# Patient Record
Sex: Male | Born: 1970 | Race: White | Hispanic: No | Marital: Married | Smoking: Never smoker
Health system: Southern US, Community
[De-identification: ages and names within clinical notes are randomized; demographics above are authoritative.]

## PROBLEM LIST (undated history)

## (undated) DIAGNOSIS — T7840XA Allergy, unspecified, initial encounter: Secondary | ICD-10-CM

## (undated) DIAGNOSIS — E785 Hyperlipidemia, unspecified: Secondary | ICD-10-CM

## (undated) DIAGNOSIS — I8390 Asymptomatic varicose veins of unspecified lower extremity: Secondary | ICD-10-CM

## (undated) DIAGNOSIS — K219 Gastro-esophageal reflux disease without esophagitis: Secondary | ICD-10-CM

## (undated) HISTORY — DX: Gastro-esophageal reflux disease without esophagitis: K21.9

## (undated) HISTORY — DX: Asymptomatic varicose veins of unspecified lower extremity: I83.90

## (undated) HISTORY — DX: Hyperlipidemia, unspecified: E78.5

## (undated) HISTORY — DX: Allergy, unspecified, initial encounter: T78.40XA

---

## 2007-09-27 DIAGNOSIS — S86011A Strain of right Achilles tendon, initial encounter: Secondary | ICD-10-CM

## 2007-09-27 HISTORY — DX: Strain of right Achilles tendon, initial encounter: S86.011A

## 2008-06-24 ENCOUNTER — Ambulatory Visit: Payer: Self-pay | Admitting: Family Medicine

## 2008-06-24 DIAGNOSIS — I868 Varicose veins of other specified sites: Secondary | ICD-10-CM

## 2010-08-23 ENCOUNTER — Ambulatory Visit: Payer: Self-pay | Admitting: Family Medicine

## 2010-08-23 DIAGNOSIS — K219 Gastro-esophageal reflux disease without esophagitis: Secondary | ICD-10-CM | POA: Insufficient documentation

## 2010-08-23 DIAGNOSIS — J209 Acute bronchitis, unspecified: Secondary | ICD-10-CM

## 2010-08-23 DIAGNOSIS — E669 Obesity, unspecified: Secondary | ICD-10-CM

## 2010-08-24 ENCOUNTER — Encounter: Payer: Self-pay | Admitting: Family Medicine

## 2010-10-04 ENCOUNTER — Encounter: Payer: Self-pay | Admitting: Family Medicine

## 2010-10-26 NOTE — Assessment & Plan Note (Signed)
Summary: acid reflux rescheduled from [place] being out/dlo   Vital Signs:  Patient profile:   40 year old male Height:      73 inches Weight:      236.0 pounds BMI:     31.25 Temp:     98.2 degrees F oral Pulse rate:   80 / minute Pulse rhythm:   regular BP sitting:   120 / 80  (left arm) Cuff size:   large  Vitals Entered By: Benny Lennert CMA Duncan Dull) (August 23, 2010 9:19 AM)  History of Present Illness: Chief complaint acid reflux/chest cold  1. In last 2 years.. poor traveling diet... intermittant GERd. Using prilosec,  and zantac generic equivalent...only taking as needed. Occ notes food getting stuck in throat, increase in mucus in throat. Occ burning in chest..heartburn occuring several times a week. occ regurgitates food.   All depends on what he eats..tomato, spicy foods, etc..unable to change diet given traveling.  Returned from [place] on 11/15...since then 1- 2 weeks of congestion, productive, brown. No fever.NO SOB. No face pain, no ear pain, some sore throat. Using robitussin DM...cough not keeping up at night.  On smoker.   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 1 Tab By Mouth Daily For 4-6 Weeks, Then Wean Off If Able. 3)  Azithromycin 250 Mg Tabs (Azithromycin) .... 2 Tab By Mouth X 1 Day, Then 1 Tab By Mouth Daily  Allergies (verified): No Known Drug Allergies  Past History:  Past medical, surgical, family and social histories (including risk factors) reviewed, and no changes noted (except as noted below).  Past Medical History: Reviewed history from 06/24/2008 and no changes required. Varicose vein, R leg  Family History: Reviewed history from 06/24/2008 and no changes required. Mom and dad, healthy  GF, DM  PGF, brain  CA  Social History: Reviewed history from 06/24/2008 and no changes required. 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 CV:  Complains of chest pain or discomfort. Resp:  Denies coughing up blood and shortness of breath. GI:  Denies abdominal pain, bloody stools, constipation, and diarrhea. GU:  Denies dysuria.  Physical Exam  General:  tall overweight appearing male in NAD Head:  no maxiullary sinus ttp Eyes:  No corneal or conjunctival inflammation noted. EOMI. Perrla. Funduscopic exam benign, without hemorrhages, exudates or papilledema. Vision grossly normal. Ears:  External ear exam shows no significant lesions or deformities.  Otoscopic examination reveals clear canals, tympanic membranes are intact bilaterally without bulging, retraction, inflammation or discharge. Hearing is grossly normal bilaterally. Nose:  External nasal examination shows no deformity or inflammation. Nasal mucosa are pink and moist without lesions or exudates. Mouth:  MMMpharynx pink and moist.   Neck:  no carotid bruit or thyromegaly no cervical or supraclavicular lymphadenopathy  Lungs:  frequent cough, no w/r/r, no increase work of breathing, clear lung fielkd throughout. Heart:  Normal rate and regular rhythm. S1 and S2 normal without gallop, murmur, click, rub or other extra sounds. Abdomen:  Bowel sounds positive,abdomen soft and non-tender without masses, organomegaly or hernias noted.   Impression & Recommendations:  Problem # 1:  ACUTE BRONCHITIS (ICD-466.0)  His updated medication list for this problem includes:    Robitussin Maximum Strength 15 Mg/61ml Syrp (Dextromethorphan hbr)    Azithromycin 250 Mg Tabs (Azithromycin) .Marland Kitchen... 2 tab by mouth x 1 day, then 1 tab by mouth daily  Take antibiotics and other medications as directed. Encouraged to  push clear liquids, get enough rest, and take acetaminophen as needed. To be seen in 5-7 days if no improvement, sooner if worse.  Problem # 2:  GASTROESOPHAGEAL REFLUX DISEASE (ICD-530.81) PPI x 4-6 weeks course, with lifestyle modification.. Info given. Call if symptoms not improved in 2 weeks for stronger PPI, if that is not successful..consider referral to GI.  Pt request nutrition rerferral given unusual diet given travel frequently..he forsees difficulty making diet adjustments.   His updated medication list for this problem includes:    Omeprazole 40 Mg Cpdr (Omeprazole) .Marland Kitchen... 1 tab by mouth daily for 4-6 weeks, then wean off if able.  Orders: Nutrition Referral (Nutrition)  Problem # 3:  OBESITY (ICD-278.00)  Orders: Nutrition Referral (Nutrition)  Complete Medication List: 1)  Robitussin Maximum Strength 15 Mg/79ml Syrp (Dextromethorphan hbr) 2)  Omeprazole 40 Mg Cpdr (Omeprazole) .Marland Kitchen.. 1 tab by mouth daily for 4-6 weeks, then wean off if able. 3)  Azithromycin 250 Mg Tabs (Azithromycin) .... 2 tab by mouth x 1 day, then 1 tab by mouth daily  Patient Instructions: 1)  Referral Appointment Information 2)  Day/Date: 3)  Time: 4)  Place/MD: 5)  Address: 6)  Phone/Fax: 7)  Patient given appointment information. Information/Orders faxed/mailed.Marland Kitchen 8)  Make lifestyle changes according to reflux info given. 9)   Start omeprazole 40 mg daily for 4-6 week course.  10)  Call if reflux not rimproving in 2 weeks.  11)  Guafenesin during the day to break up mucus. 12)   Start antibiotics. Prescriptions: AZITHROMYCIN 250 MG TABS (AZITHROMYCIN) 2 tab by mouth x 1 day, then 1 tab by mouth daily  #6 x 0   Entered and Authorized by:   Kerby Nora MD   Signed by:   Kerby Nora MD on 08/23/2010   Method used:   Electronically to        [facility]  [address] (retail)       [address]       [place], [place]  [address]       Ph: [phone] or [phone]       Fax: [phone]    RxID:   [id number] OMEPRAZOLE 40 MG CPDR (OMEPRAZOLE) 1 tab by mouth daily for 4-6 weeks, then wean off if able.  #30 x 1   Entered and Authorized by:   Kerby Nora MD   Signed by:   Kerby Nora MD on 08/23/2010   Method used:   [facility]        [facility]  [address] (retail)       [address]       [place], [place]  [address]       Ph: [phone] or [phone]       Fax: [phone]   RxID:   [id number]    Orders Added: 1)  Nutrition Referral [Nutrition] 2)  Est. Patient Level IV [[address]]    Current Allergies (reviewed today): No known allergies

## 2010-10-26 NOTE — Miscellaneous (Signed)
Summary: [facility]  Referral orders/[facility]   Imported By: Lester Woodstock 08/28/2010 09:43:03  _____________________________________________________________________  External Attachment:    Type:   Image     Comment:   External Document

## 2010-10-28 NOTE — Letter (Signed)
Summary: Patient Will Reschedule/[facility]  Patient Will Reschedule/[facility]   Imported By: Lanelle Bal 10/13/2010 10:51:10  _____________________________________________________________________  External Attachment:    Type:   Image     Comment:   External Document

## 2012-04-05 ENCOUNTER — Ambulatory Visit (INDEPENDENT_AMBULATORY_CARE_PROVIDER_SITE_OTHER): Payer: 59 | Admitting: Family Medicine

## 2012-04-05 ENCOUNTER — Encounter: Payer: Self-pay | Admitting: Family Medicine

## 2012-04-05 VITALS — BP 112/84 | HR 72 | Temp 97.4°F | Wt 243.0 lb

## 2012-04-05 DIAGNOSIS — M79609 Pain in unspecified limb: Secondary | ICD-10-CM

## 2012-04-05 DIAGNOSIS — M6752 Plica syndrome, left knee: Secondary | ICD-10-CM

## 2012-04-05 DIAGNOSIS — M7541 Impingement syndrome of right shoulder: Secondary | ICD-10-CM

## 2012-04-05 DIAGNOSIS — M79604 Pain in right leg: Secondary | ICD-10-CM

## 2012-04-05 DIAGNOSIS — M6750 Plica syndrome, unspecified knee: Secondary | ICD-10-CM

## 2012-04-05 NOTE — Progress Notes (Signed)
Nature conservation officer at [facility] [address] [place] [address] Phone: [phone] Fax: [phone]  Date:  04/05/2012   Name:  Louis Marquez   DOB:  1970/10/18   MRN:  [id number]  PCP:  Louis Beat, MD    Chief Complaint: Pain in left knee, right shoulder, problems with right leg   History of Present Illness:  Louis Marquez is a 41 y.o. very pleasant male patient who presents with the following:  Left knee, felt like something poppedd a few weeks ago. When getting up and down, will feel some pain and medially. He is not having any effusions. He has not been having any symptomatic anyway. No locking up of the joint. Most of his discomfort his medial  Still some R knee tenderess.after his reseal operation, he more is some occasional discomfort in his medial upper thigh. Occasionally he did have some tingling sensation, but right now he's not really having a period  Some R sided shoulder impingement.occasional pain with abduction, but this is mostly with doing overhead movements.  Patient Active Problem List  Diagnosis  . OBESITY  . VARICES OF OTHER SITES  . ACUTE BRONCHITIS  . GASTROESOPHAGEAL REFLUX DISEASE   Past Medical History  Diagnosis Date  . Varicose vein of leg    No past surgical history on file. History  Substance Use Topics  . Smoking status: Never Smoker   . Smokeless tobacco: Not on file  . Alcohol Use: Yes   Family History  Problem Relation Age of Onset  . Cancer Paternal Grandfather     brain   No Known Allergies  Medication list has been reviewed and updated.  No current outpatient prescriptions on file prior to visit.    Review of Systems:  GEN: No acute illnesses, no fevers, chills. GI: No n/v/d, eating normally Pulm: No SOB Interactive and getting along well at home.  Otherwise, ROS is as per the HPI.  Physical Examination: Filed Vitals:   04/05/12 1630  BP: 112/84  Pulse: 72  Temp: 97.4 F (36.3 C)    Filed Vitals:   04/05/12 1630  Weight: 243 lb (110.224 kg)   There is no height on file to calculate BMI. Ideal Body Weight:     GEN: WDWN, NAD, Non-toxic, Alert & Oriented x 3 HEENT: Atraumatic, Normocephalic.  Ears and Nose: No external deformity. EXTR: No clubbing/cyanosis/edema NEURO: Normal gait.  PSYCH: Normally interactive. Conversant. Not depressed or anxious appearing.  Calm demeanor.   LEFT knee: No effusion. Nontender on the joint lines. Nontender with patellar facet loading. Moderately tender to medial plical bands they're quite large. Stable to varus and valgus stress. Negative McMurray's. Negative flexion pinch. Negative Lachman maneuver.  RIGHT shoulder: Full range of motion, strength 5/5. Minimal impingement with doing Hawkins and Neer's. Negative crossover.   EKG / Labs / Xrays: None available at time of encounter  Assessment and Plan: 1. Plica syndrome of left knee   2. Impingement syndrome of right shoulder   3. Right leg pain     >25 minutes spent in face to face time with patient, >50% spent in counselling or coordination of care: 30 minutes spent face-to-face, reviewing anatomy of all problems. Discussed pathophysiology and treatment. I think the primary issue with his LEFT knee is so painful plical bands rubbing against his patella. Reviewed rubbing these with his fingers and some ice. If he is still symptomatic in a couple of months, we could certainly inject these.  I think he is having only some rare impingement with lifting weights. We talked about doing some modifications, and I gave him a rotator cuff and scapular stabilization program from [facility] and reviewed this in the office.  Intermittent RIGHT leg pain, the patient did have some extensive varicose veins that he had surgery for. Certainly is not having a pain or would not suspect any intra-articular pathology of the knee or hip. He also has no back pain. Tried to reassure him.  Louis Beat,  MD

## 2012-04-06 ENCOUNTER — Other Ambulatory Visit: Payer: Self-pay | Admitting: Family Medicine

## 2012-04-06 DIAGNOSIS — R5381 Other malaise: Secondary | ICD-10-CM

## 2012-04-06 DIAGNOSIS — Z13220 Encounter for screening for lipoid disorders: Secondary | ICD-10-CM

## 2012-04-25 ENCOUNTER — Other Ambulatory Visit (INDEPENDENT_AMBULATORY_CARE_PROVIDER_SITE_OTHER): Payer: 59

## 2012-04-25 DIAGNOSIS — R5383 Other fatigue: Secondary | ICD-10-CM

## 2012-04-25 DIAGNOSIS — Z13220 Encounter for screening for lipoid disorders: Secondary | ICD-10-CM

## 2012-04-25 DIAGNOSIS — R5381 Other malaise: Secondary | ICD-10-CM

## 2012-04-25 LAB — CBC WITH DIFFERENTIAL/PLATELET
Basophils Absolute: 0 K/uL (ref 0.0–0.1)
Basophils Relative: 0.4 % (ref 0.0–3.0)
Eosinophils Absolute: 0.3 K/uL (ref 0.0–0.7)
Eosinophils Relative: 3.5 % (ref 0.0–5.0)
HCT: 45.4 % (ref 39.0–52.0)
Hemoglobin: 15.5 g/dL (ref 13.0–17.0)
Lymphocytes Relative: 45.9 % (ref 12.0–46.0)
Lymphs Abs: 3.5 K/uL (ref 0.7–4.0)
MCHC: 34.1 g/dL (ref 30.0–36.0)
MCV: 91 fl (ref 78.0–100.0)
Monocytes Absolute: 0.6 K/uL (ref 0.1–1.0)
Monocytes Relative: 7.5 % (ref 3.0–12.0)
Neutro Abs: 3.2 K/uL (ref 1.4–7.7)
Neutrophils Relative %: 42.7 % — ABNORMAL LOW (ref 43.0–77.0)
Platelets: 222 K/uL (ref 150.0–400.0)
RBC: 4.99 Mil/uL (ref 4.22–5.81)
RDW: 13.2 % (ref 11.5–14.6)
WBC: 7.6 K/uL (ref 4.5–10.5)

## 2012-04-25 LAB — LIPID PANEL
Cholesterol: 213 mg/dL — ABNORMAL HIGH (ref 0–200)
HDL: 48.3 mg/dL
Total CHOL/HDL Ratio: 4
Triglycerides: 458 mg/dL — ABNORMAL HIGH (ref 0.0–149.0)
VLDL: 91.6 mg/dL — ABNORMAL HIGH (ref 0.0–40.0)

## 2012-04-25 LAB — LDL CHOLESTEROL, DIRECT: Direct LDL: 89.1 mg/dL

## 2012-04-25 LAB — BASIC METABOLIC PANEL
Calcium: 9.5 mg/dL (ref 8.4–10.5)
Creatinine, Ser: 0.9 mg/dL (ref 0.4–1.5)
GFR: 95.02 mL/min (ref >60.00)

## 2012-04-25 LAB — HEPATIC FUNCTION PANEL
AST: 25 U/L (ref 0–37)
Albumin: 4 g/dL (ref 3.5–5.2)
Alkaline Phosphatase: 63 U/L (ref 39–117)

## 2012-04-25 LAB — TSH: TSH: 3.6 u[IU]/mL (ref 0.35–5.50)

## 2012-04-30 ENCOUNTER — Encounter: Payer: Self-pay | Admitting: Family Medicine

## 2012-04-30 ENCOUNTER — Ambulatory Visit (INDEPENDENT_AMBULATORY_CARE_PROVIDER_SITE_OTHER): Payer: 59 | Admitting: Family Medicine

## 2012-04-30 VITALS — BP 150/100 | HR 114 | Temp 98.6°F | Ht 73.0 in | Wt 244.8 lb

## 2012-04-30 DIAGNOSIS — Z0000 Encounter for general adult medical examination without abnormal findings: Secondary | ICD-10-CM

## 2012-04-30 MED ORDER — UREA 40 % EX CREA: TOPICAL_CREAM | CUTANEOUS | Status: DC

## 2012-04-30 NOTE — Patient Instructions (Addendum)
[employer] WEBSITE HAS A GREAT DEAL OF INFORMATION ABOUT HEART HEALTHY DIETS   Cholesterol Control Diet Cholesterol levels in your body are determined significantly by your diet. Cholesterol levels may also be related to heart disease. The following material helps to explain this relationship and discusses what you can do to help keep your heart healthy. Not all cholesterol is bad. Low-density lipoprotein (LDL) cholesterol is the "bad" cholesterol. It may cause fatty deposits to build up inside your arteries. High-density lipoprotein (HDL) cholesterol is "good." It helps to remove the "bad" LDL cholesterol from your blood. Cholesterol is a very important risk factor for heart disease. Other risk factors are high blood pressure, smoking, stress, heredity, and weight. The heart muscle gets its supply of blood through the coronary arteries. If your LDL cholesterol is high and your HDL cholesterol is low, you are at risk for having fatty deposits build up in your coronary arteries. This leaves less room through which blood can flow. Without sufficient blood and oxygen, the heart muscle cannot function properly and you may feel chest pains (angina pectoris). When a coronary artery closes up entirely, a part of the heart muscle may die, causing a heart attack (myocardial infarction). CHECKING CHOLESTEROL When your caregiver sends your blood to a lab to be analyzed for cholesterol, a complete lipid (fat) profile may be done. With this test, the total amount of cholesterol and levels of LDL and HDL are determined. Triglycerides are a type of fat that circulates in the blood and can also be used to determine heart disease risk. The list below describes what the numbers should be: Test: Total Cholesterol.  Less than 200 mg/dl.  Test: LDL "bad cholesterol."  Less than 100 mg/dl.   Less than 70 mg/dl if you are at very high risk of a heart attack or sudden cardiac death.  Test: HDL "good  cholesterol."  Greater than 50 mg/dl for women.   Greater than 40 mg/dl for men.  Test: Triglycerides.  Less than 150 mg/dl.  CONTROLLING CHOLESTEROL WITH DIET Although exercise and lifestyle factors are important, your diet is key. That is because certain foods are known to raise cholesterol and others to lower it. The goal is to balance foods for their effect on cholesterol and more importantly, to replace saturated and trans fat with other types of fat, such as monounsaturated fat, polyunsaturated fat, and omega-3 fatty acids. On average, a person should consume no more than 15 to 17 g of saturated fat daily. Saturated and trans fats are considered "bad" fats, and they will raise LDL cholesterol. Saturated fats are primarily found in animal products such as meats, butter, and cream. However, that does not mean you need to sacrifice all your favorite foods. Today, there are good tasting, low-fat, low-cholesterol substitutes for most of the things you like to eat. Choose low-fat or nonfat alternatives. Choose round or loin cuts of red meat, since these types of cuts are lowest in fat and cholesterol. Chicken (without the skin), fish, veal, and ground [place] breast are excellent choices. Eliminate fatty meats, such as hot dogs and salami. Even shellfish have little or no saturated fat. Have a 3 oz (85 g) portion when you eat lean meat, poultry, or fish. Trans fats are also called "partially hydrogenated oils." They are oils that have been scientifically manipulated so that they are solid at room temperature resulting in a longer shelf life and improved taste and texture of foods in which they are added. Trans  fats are found in stick margarine, some tub margarines, cookies, crackers, and baked goods.  When baking and cooking, oils are an excellent substitute for butter. The monounsaturated oils are especially beneficial since it is believed they lower LDL and raise HDL. The oils you should avoid entirely  are saturated tropical oils, such as coconut and palm.  Remember to eat liberally from food groups that are naturally free of saturated and trans fat, including fish, fruit, vegetables, beans, grains (barley, rice, couscous, bulgur wheat), and pasta (without cream sauces).  IDENTIFYING FOODS THAT LOWER CHOLESTEROL  Soluble fiber may lower your cholesterol. This type of fiber is found in fruits such as apples, vegetables such as broccoli, potatoes, and carrots, legumes such as beans, peas, and lentils, and grains such as barley. Foods fortified with plant sterols (phytosterol) may also lower cholesterol. You should eat at least 2 g per day of these foods for a cholesterol lowering effect.  Read package labels to identify low-saturated fats, trans fats free, and low-fat foods at the supermarket. Select cheeses that have only 2 to 3 g saturated fat per ounce. Use a heart-healthy tub margarine that is free of trans fats or partially hydrogenated oil. When buying baked goods (cookies, crackers), avoid partially hydrogenated oils. Breads and muffins should be made from whole grains (whole-wheat or whole oat flour, instead of "flour" or "enriched flour"). Buy non-creamy canned soups with reduced salt and no added fats.  FOOD PREPARATION TECHNIQUES  Never deep-fry. If you must fry, either stir-fry, which uses very little fat, or use non-stick cooking sprays. When possible, broil, bake, or roast meats, and steam vegetables. Instead of dressing vegetables with butter or margarine, use lemon and herbs, applesauce and cinnamon (for squash and sweet potatoes), nonfat yogurt, salsa, and low-fat dressings for salads.  LOW-SATURATED FAT / LOW-FAT FOOD SUBSTITUTES Meats / Saturated Fat (g)  Avoid: Steak, marbled (3 oz/85 g) / 11 g   Choose: Steak, lean (3 oz/85 g) / 4 g   Avoid: Hamburger (3 oz/85 g) / 7 g   Choose: Hamburger, lean (3 oz/85 g) / 5 g   Avoid: Ham (3 oz/85 g) / 6 g   Choose: Ham, lean cut (3 oz/85  g) / 2.4 g   Avoid: Chicken, with skin, dark meat (3 oz/85 g) / 4 g   Choose: Chicken, skin removed, dark meat (3 oz/85 g) / 2 g   Avoid: Chicken, with skin, light meat (3 oz/85 g) / 2.5 g   Choose: Chicken, skin removed, light meat (3 oz/85 g) / 1 g  Dairy / Saturated Fat (g)  Avoid: Whole milk (1 cup) / 5 g   Choose: Low-fat milk, 2% (1 cup) / 3 g   Choose: Low-fat milk, 1% (1 cup) / 1.5 g   Choose: Skim milk (1 cup) / 0.3 g   Avoid: Hard cheese (1 oz/28 g) / 6 g   Choose: Skim milk cheese (1 oz/28 g) / 2 to 3 g   Avoid: Cottage cheese, 4% fat (1 cup) / 6.5 g   Choose: Low-fat cottage cheese, 1% fat (1 cup) / 1.5 g   Avoid: Ice cream (1 cup) / 9 g   Choose: Sherbet (1 cup) / 2.5 g   Choose: Nonfat frozen yogurt (1 cup) / 0.3 g   Choose: Frozen fruit bar / trace   Avoid: Whipped cream (1 tbs) / 3.5 g   Choose: Nondairy whipped topping (1 tbs) / 1 g  Condiments /  Saturated Fat (g)  Avoid: Mayonnaise (1 tbs) / 2 g   Choose: Low-fat mayonnaise (1 tbs) / 1 g   Avoid: Butter (1 tbs) / 7 g   Choose: Extra light margarine (1 tbs) / 1 g   Avoid: Coconut oil (1 tbs) / 11.8 g   Choose: Olive oil (1 tbs) / 1.8 g   Choose: Corn oil (1 tbs) / 1.7 g   Choose: Safflower oil (1 tbs) / 1.2 g   Choose: Sunflower oil (1 tbs) / 1.4 g   Choose: Soybean oil (1 tbs) / 2.4 g   Choose: Canola oil (1 tbs) / 1 g  Document Released: 09/12/2005 Document Revised: 09/01/2011 Document Reviewed: 03/03/2011 [facility] Patient Information 2012 [employer], [place].Serving Sizes What we call a serving size today is larger than it was in the past. A 1950s fast-food burger contained little more than 1 oz of meat, and a soft drink was 8 oz (1 cup). Today, a "quarter pounder" burger is at least 4 times that amount, and a 32 or 64 oz drink is not uncommon. A possible guide for eating when trying to lose weight is to eat about half as much as you normally do. Some estimates of serving sizes are:  1  Dairy serving:Individual container of yogurt (8 oz) or piece of cheese the size of your thumb (1 oz).   1 Grain serving: 1 slice of bread or  cup pasta.   1 Meat serving: The size of a deck of cards (3 oz).   1 Fruit serving: cup canned fruit or 1 medium fruit.   1 Vegetable serving:  cup of cooked or canned vegetables.   1 Fat serving:The size of 4 stacked dimes.  Experts suggest spending 1 or 2 days measuring food portions you commonly eat. This will give you better practice at estimating serving sizes, and will also show whether you are eating an appropriate amount of food to meet your weight goals. If you find that you are eating more than you thought, try measuring your food for a few days so you can "reprogram" yourself to learn what makes a healthy portion for you. SUGGESTIONS FOR CONTROL  In restaurants, share entrees, or ask the waiter to put half the entre in a box or bag before you even touch it.   Order lunch-sized portions. Many restaurants serve 4 to 6 oz of meat at lunch, compared with 8 to 10 oz at dinner.   Split dessert or skip it all together. Have a piece of fruit when you get home.   At home, use smaller plates and bowls. It will look as if you are eating more.   Plate your food in the kitchen rather than serving it "family style" at the table.   Wait 20 to 30 minutes before taking seconds. This is how long it takes your brain to recognize that you are full.   Check food labels for serving sizes. Eat 1 serving only.   Use measuring cups and spoons to see proper serving sizes.   Buy smaller packages of candy, popcorn, and snacks.   Avoid eating directly out of the bag or carton.   While eating half as much, exercise twice as much. Park further away from the mall, take the stairs instead of the escalator, and walk around your block.  Losing weight is a slow, difficult process. It takes long-lasting lifestyle changes. You can make gradual changes over time so  they become habits. Look to friends  and family to support the healthy changes you are making. Avoid fad diets since they are often only temporary weight loss solutions. Document Released: 06/11/2003 Document Revised: 09/01/2011 Document Reviewed: 07/21/2009 [facility] Patient Information 2012 [place], [place].Cardiac Diet This diet can help prevent heart disease and stroke. Many factors influence your heart health, including eating and exercise habits. Coronary risk rises a lot with abnormal blood fat (lipid) levels. Cardiac meal planning includes limiting unhealthy fats, increasing healthy fats, and making other small dietary changes. General guidelines are as follows:  Adjust calorie intake to reach and maintain desirable body weight.   Limit total fat intake to less than 30% of total calories. Saturated fat should be less than 7% of calories.   Saturated fats are found in animal products and in some vegetable products. Saturated vegetable fats are found in coconut oil, cocoa butter, palm oil, and palm kernel oil. Read labels carefully to avoid these products as much as possible. Use butter in moderation. Choose tub margarines and oils that have 2 grams of fat or less. Good cooking oils are canola and olive oils.   Practice low-fat cooking techniques. Do not fry food. Instead, broil, bake, boil, steam, grill, roast on a rack, stir-fry, or microwave it. Other fat reducing suggestions include:   Remove the skin from poultry.   Remove all visible fat from meats.   Skim the fat off stews, soups, and gravies before serving them.   Steam vegetables in water or broth instead of sauting them in fat.   Avoid foods with trans fat (or hydrogenated oils), such as commercially fried foods and commercially baked goods. Commercial shortening and deep-frying fats will contain trans fat.   Increase intake of fruits, vegetables, whole grains, and legumes to replace foods high in fat.   Increase consumption of  nuts, legumes, and seeds to at least 4 servings weekly. One serving of a legume equals  cup, and 1 serving of nuts or seeds equals  cup.   Choose whole grains more often. Have 3 servings per day (a serving is 1 ounce [oz]).   Have at least 4 cups of fruit and vegetable a day.   Increase your intake of soluble fiber to 10 to 25 grams per day. Soluble fiber binds cholesterol to be removed from the blood. Foods high in soluble fiber are dried beans, citrus fruits, oats, apples, bananas, broccoli, Brussels sprouts, and eggplant.   Try to include foods fortified with plant sterols or stanols, such as yogurt, breads, juices, or margarines. Choose several fortified foods to achieve a daily intake of 2 to 3 grams of plant sterols or stanols.   Foods with omega-3 fats can help reduce your risk of heart disease. Aim to have a 3.5 oz portion of fatty fish twice per week, such as salmon, mackerel, albacore tuna, sardines, lake trout, or herring. If you wish to take a fish oil supplement, choose one that contains 1 gram of both DHA and EPA.   Limit processed meats to 2 servings (3 oz portion) weekly.   Limit the sodium in your diet to 1500 milligrams (mg) per day. If you have high blood pressure, talk to a registered dietitian about a DASH (Dietary Approaches to Stop Hypertension) eating plan.   Limit beverages with added sugar, such as soda, to no more than 36 ounces per week.  CHOOSING FOODS Starches  Allowed: Breads: All kinds (wheat, rye, raisin, white, oatmeal, [place], [place], and English muffin bread). Low-fat rolls: English muffins, frankfurter and  hamburger buns, bagels, pita bread, tortillas (not fried). Pancakes, waffles, biscuits, and muffins made with recommended oil.   Avoid: Products made with saturated or trans fats, oils, or whole milk products. Butter rolls, cheese breads, croissants. Commercial doughnuts, muffins, sweet rolls, biscuits, waffles, pancakes, store-bought mixes.   Crackers  Allowed: Low-fat crackers and snacks: Animal, graham, rye, saltine (with recommended oil, no lard), oyster, and matzo crackers. Bread sticks, melba toast, rusks, flatbread, pretzels, and light popcorn.   Avoid: High-fat crackers: cheese crackers, butter crackers, and those made with coconut, palm oil, or trans fat (hydrogenated oils). Buttered popcorn.  Cereals  Allowed: Hot or cold whole-grain cereals.   Avoid: Cereals containing coconut, hydrogenated vegetable fat, or animal fat.  Potatoes / Pasta / Rice  Allowed: All kinds of potatoes, rice, and pasta (such as macaroni, spaghetti, and noodles).   Avoid: Pasta or rice prepared with cream sauce or high-fat cheese. Chow mein noodles, [place] fries.  Vegetables  Allowed: All vegetables and vegetable juices.   Avoid: Fried vegetables. Vegetables in cream, butter, or high-fat cheese sauces. Limit coconut. Fruit in cream or custard.  Meat and Meat Substitutes  Allowed: Limit your intake of meat, seafood, and poultry to no more than 6 oz (cooked weight) per day. All lean, well-trimmed beef, veal, pork, and lamb. All chicken and [place] without skin. All fish and shellfish. Wild game: wild duck, rabbit, pheasant, and venison. Meatless dishes: recipes with dried beans, peas, lentils, and tofu (soybean curd). Seeds and nuts: all seeds and most nuts.   Avoid: Prime grade and other heavily marbled and fatty meats, such as short ribs, spare ribs, rib eye roast or steak, frankfurters, sausage, bacon, and high-fat luncheon meats, mutton. Caviar. Commercially fried fish. Domestic duck, goose, venison sausage. Organ meats: liver, gizzard, heart, chitterlings, brains, kidney, sweetbreads.  Dairy  Allowed: Egg whites or low-cholesterol egg substitutes may be used as desired. Low-fat cheeses: nonfat or low-fat cottage cheese (1% or 2% fat), cheeses made with part skim milk, such as mozzarella, farmers, string, or ricotta. (Cheeses should be  labeled no more than 2 to 6 grams fat per oz.)   Avoid: Whole milk cheeses, including colby, cheddar, [place], [address], [place], [place], [place], [address], Swiss, and blue. Creamed cottage cheese, cream cheese.  Milk  Allowed: Skim (or 1%) milk: liquid, powdered, or evaporated. Buttermilk made with low-fat milk. Drinks made with skim or low-fat milk or cocoa. Chocolate milk or cocoa made with skim or low-fat (1%) milk. Nonfat or low-fat yogurt.   Avoid: Whole milk and whole milk products, including buttermilk or yogurt made from whole milk, drinks made from whole milk. Condensed milk, evaporated whole milk, and 2% milk.  Soups and Combination Foods  Allowed: Low-fat low-sodium soups: broth, dehydrated soups, homemade broth, soups with the fat removed, homemade cream soups made with skim or low-fat milk. Low-fat spaghetti, lasagna, chili, and Spanish rice if low-fat ingredients and low-fat cooking techniques are used.   Avoid: Cream soups made with whole milk, cream, or high-fat cheese. All other soups.  Desserts and Sweets  Allowed: Sherbet, fruit ices, gelatins, meringues, and angel food cake. Homemade desserts with recommended fats, oils, and milk products. Jam, jelly, honey, marmalade, sugars, and syrups. Pure sugar candy, such as gum drops, hard candy, jelly beans, marshmallows, mints, and small amounts of dark chocolate.   Avoid: Commercially prepared cakes, pies, cookies, frosting, pudding, or mixes for these products. Desserts containing whole milk products, chocolate, coconut, lard, palm oil, or palm kernel oil.  Ice cream or ice cream drinks. Candy that contains chocolate, coconut, butter, hydrogenated fat, or unknown ingredients. Buttered syrups.  Fats and Oils  Allowed: Vegetable oils: safflower, sunflower, corn, soybean, cottonseed, sesame, canola, olive, or peanut. Non-hydrogenated margarines. Salad dressing or mayonnaise: homemade or commercial, made with a recommended oil.  Low or nonfat salad dressing or mayonnaise.   Limit added fats and oils to 6 to 8 tsp per day (includes fats used in cooking, baking, salads, and spreads on bread). Remember to count the "hidden fats" in foods.   Avoid: Solid fats and shortenings: butter, lard, salt pork, bacon drippings. Gravy containing meat fat, shortening, or suet. Cocoa butter, coconut. Coconut oil, palm oil, palm kernel oil, or hydrogenated oils: these ingredients are often used in bakery products, nondairy creamers, whipped toppings, candy, and commercially fried foods. Read labels carefully. Salad dressings made of unknown oils, sour cream, or cheese, such as blue cheese and Roquefort. Cream, all kinds: half-and-half, light, heavy, or whipping. Sour cream or cream cheese (even if "light" or low-fat). Nondairy cream substitutes: coffee creamers and sour cream substitutes made with palm, palm kernel, hydrogenated oils, or coconut oil.  Beverages  Allowed: Coffee (regular or decaffeinated), tea. diet carbonated beverages, mineral water. Alcohol: Check with your caregiver. Moderation is recommended.   Avoid: Whole milk, regular sodas, and juice drinks with added sugar.  Condiments  Allowed: All seasonings and condiments. Cocoa powder. "Cream" sauces made with recommended ingredients.   Avoid: Carob powder made with hydrogenated fats.  SAMPLE MENU Breakfast   cup orange juice    cup oatmeal   1 slice toast   1 tsp margarine   1 cup skim milk  Lunch  [place] sandwich with 2 oz [place], 2 slices bread   Lettuce and tomato slices   Fresh fruit   Carrot sticks   Coffee or tea   Snack   Fresh fruit or low-fat crackers  Dinner  3 oz lean ground beef   1 baked potato   1 tsp margarine    cup asparagus   Lettuce salad   1 tbs non-creamy dressing    cup peach slices   1 cup skim milk  Document Released: 06/21/2008 Document Revised: 09/01/2011 Document Reviewed: 09/14/2010 [facility] Patient  Information 2012 [place], [place].Calorie Counting Diet A calorie counting diet requires you to eat the number of calories that are right for you in a day. Calories are the measurement of how much energy you get from the food you eat. Eating the right amount of calories is important for staying at a healthy weight. If you eat too many calories, your body will store them as fat and you may gain weight. If you eat too few calories, you may lose weight. Counting the number of calories you eat during a day will help you know if you are eating the right amount. A Registered Dietitian can determine how many calories you need in a day. The amount of calories needed varies from person to person. If your goal is to lose weight, you will need to eat fewer calories. Losing weight can benefit you if you are overweight or have health problems such as heart disease, high blood pressure, or diabetes. If your goal is to gain weight, you will need to eat more calories. Gaining weight may be necessary if you have a certain health problem that causes your body to need more energy. TIPS Whether you are increasing or decreasing the number of calories you eat during a day,  it may be hard to get used to changes in what you eat and drink. The following are tips to help you keep track of the number of calories you eat.  Measure foods at home with measuring cups. This helps you know the amount of food and number of calories you are eating.   Restaurants often serve food in amounts that are larger than 1 serving. While eating out, estimate how many servings of a food you are given. For example, a serving of cooked rice is  cup or about the size of half of a fist. Knowing serving sizes will help you be aware of how much food you are eating at restaurants.   Ask for smaller portion sizes or child-size portions at restaurants.   Plan to eat half of a meal at a restaurant. Take the rest home or share the other half with a friend.    Read the Nutrition Facts panel on food labels for calorie content and serving size. You can find out how many servings are in a package, the size of a serving, and the number of calories each serving has.   For example, a package might contain 3 cookies. The Nutrition Facts panel on that package says that 1 serving is 1 cookie. Below that, it will say there are 3 servings in the container. The calories section of the Nutrition Facts label says there are 90 calories. This means there are 90 calories in 1 cookie (1 serving). If you eat 1 cookie you have eaten 90 calories. If you eat all 3 cookies, you have eaten 270 calories (3 servings x 90 calories = 270 calories).  The list below tells you how big or small some common portion sizes are.  1 oz.........4 stacked dice.   3 oz........Marland KitchenDeck of cards.   1 tsp.......Marland KitchenTip of little finger.   1 tbs......Marland KitchenMarland KitchenThumb.   2 tbs.......Marland KitchenGolf ball.    cup......Marland KitchenHalf of a fist.   1 cup.......Marland KitchenA fist.  KEEP A FOOD LOG Write down every food item you eat, the amount you eat, and the number of calories in each food you eat during the day. At the end of the day, you can add up the total number of calories you have eaten. It may help to keep a list like the one below. Find out the calorie information by reading the Nutrition Facts panel on food labels. Breakfast  Bran cereal (1 cup, 110 calories).   Fat-free milk ( cup, 45 calories).  Snack  Apple (1 medium, 80 calories).  Lunch  Spinach (1 cup, 20 calories).   Tomato ( medium, 20 calories).   Chicken breast strips (3 oz, 165 calories).   Shredded cheddar cheese ( cup, 110 calories).   Light [place] dressing (2 tbs, 60 calories).   Whole-wheat bread (1 slice, 80 calories).   Tub margarine (1 tsp, 35 calories).   Vegetable soup (1 cup, 160 calories).  Dinner  Pork chop (3 oz, 190 calories).   Brown rice (1 cup, 215 calories).   Steamed broccoli ( cup, 20 calories).   Strawberries  (1  cup, 65 calories).   Whipped cream (1 tbs, 50 calories).  Daily Calorie Total: 1425 Document Released: 09/12/2005 Document Revised: 09/01/2011 Document Reviewed: 03/09/2007 [facility] Patient Information 2012 [place], [place].

## 2012-04-30 NOTE — Progress Notes (Signed)
Nature conservation officer at [facility] [address] [place] [address] Phone: [phone] Fax: [phone]  Date:  04/30/2012   Name:  Louis Marquez   DOB:  [birth year]   MRN:  [id number]  PCP:  Hannah Beat, MD    Chief Complaint: Annual Exam   History of Present Illness:  Louis Marquez is a 41 y.o. very pleasant male patient who presents with the following:  Preventative Health Maintenance Visit:  Health Maintenance Summary Reviewed and updated, unless pt declines services.  Tobacco History Reviewed. Alcohol: none to up to 12 on the weekend Exercise Habits: sometimes riding bike now STD concerns: no risk or activity to increase risk Drug Use: None Encouraged self-testicular check  Health Maintenance  Topic Date Due  . Tetanus/tdap  06/08/1992  . Influenza Vaccine  06/26/2012    Labs reviewed with the patient.  Results for orders placed in visit on 04/25/12  LIPID PANEL      Component Value Range   Cholesterol 213 (*) 0 - 200 mg/dL   Triglycerides   (*) 0.0 - 149.0 mg/dL   Value: 865.7 Triglyceride is over 400; calculations on Lipids are invalid.   HDL 48.30  >39.00 mg/dL   VLDL 84.6 (*) 0.0 - 96.2 mg/dL   Total CHOL/HDL Ratio 4    HEPATIC FUNCTION PANEL      Component Value Range   Total Bilirubin 0.7  0.3 - 1.2 mg/dL   Bilirubin, Direct 0.1  0.0 - 0.3 mg/dL   Alkaline Phosphatase 63  39 - 117 U/L   AST 25  0 - 37 U/L   ALT 26  0 - 53 U/L   Total Protein 6.8  6.0 - 8.3 g/dL   Albumin 4.0  3.5 - 5.2 g/dL  TSH      Component Value Range   TSH 3.60  0.35 - 5.50 uIU/mL  CBC WITH DIFFERENTIAL      Component Value Range   WBC 7.6  4.5 - 10.5 K/uL   RBC 4.99  4.22 - 5.81 Mil/uL   Hemoglobin 15.5  13.0 - 17.0 g/dL   HCT 95.2  84.1 - 32.4 %   MCV 91.0  78.0 - 100.0 fl   MCHC 34.1  30.0 - 36.0 g/dL   RDW 40.1  02.7 - 25.3 %   Platelets 222.0  150.0 - 400.0 K/uL   Neutrophils Relative 42.7 (*) 43.0 - 77.0 %   Lymphocytes Relative 45.9  12.0 -  46.0 %   Monocytes Relative 7.5  3.0 - 12.0 %   Eosinophils Relative 3.5  0.0 - 5.0 %   Basophils Relative 0.4  0.0 - 3.0 %   Neutro Abs 3.2  1.4 - 7.7 K/uL   Lymphs Abs 3.5  0.7 - 4.0 K/uL   Monocytes Absolute 0.6  0.1 - 1.0 K/uL   Eosinophils Absolute 0.3  0.0 - 0.7 K/uL   Basophils Absolute 0.0  0.0 - 0.1 K/uL  BASIC METABOLIC PANEL      Component Value Range   Sodium 140  135 - 145 mEq/L   Potassium 4.2  3.5 - 5.1 mEq/L   Chloride 103  96 - 112 mEq/L   CO2 29  19 - 32 mEq/L   Glucose, Bld 93  70 - 99 mg/dL   BUN 13  6 - 23 mg/dL   Creatinine, Ser 0.9  0.4 - 1.5 mg/dL   Calcium 9.5  8.4 - 66.4 mg/dL   GFR 40.34  >  60.00 mL/min  LDL CHOLESTEROL, DIRECT      Component Value Range   Direct LDL 89.1     Trigs elevated, travelling a lot on the road  Patient Active Problem List  Diagnosis  . OBESITY  . VARICES OF OTHER SITES  . ACUTE BRONCHITIS  . GASTROESOPHAGEAL REFLUX DISEASE    Past Medical History  Diagnosis Date  . Varicose vein of leg     No past surgical history on file.  History  Substance Use Topics  . Smoking status: Never Smoker   . Smokeless tobacco: Not on file  . Alcohol Use: Yes    Family History  Problem Relation Age of Onset  . Cancer Paternal Grandfather     brain    No Known Allergies  Medication list has been reviewed and updated.  No current outpatient prescriptions on file prior to visit.    Review of Systems:   General: Denies fever, chills, sweats. No significant weight loss. Eyes: Denies blurring,significant itching ENT: Denies earache, sore throat, and hoarseness. Cardiovascular: Denies chest pains, palpitations, dyspnea on exertion Respiratory: Denies cough, dyspnea at rest,wheeezing Breast: no concerns about lumps GI: Denies nausea, vomiting, diarrhea, constipation, change in bowel habits, abdominal pain, melena, hematochezia GU: Denies penile discharge, ED, urinary flow / outflow problems. No STD  concerns. Musculoskeletal: recent shoulder and knee pain improved Derm: hard corn and callus on bottom of foot Neuro: Denies  paresthesias, frequent falls, frequent headaches Psych: Denies depression, anxiety Endocrine: Denies cold intolerance, heat intolerance, polydipsia Heme: Denies enlarged lymph nodes Allergy: No hayfever   Physical Examination: Filed Vitals:   04/30/12 1413  BP: 150/100  Pulse: 114  Temp: 98.6 F (37 C)   Filed Vitals:   04/30/12 1413  Height: 6\' 1"  (1.854 m)  Weight: 244 lb 12.8 oz (111.041 kg)   Body mass index is 32.30 kg/(m^2). Ideal Body Weight: Weight in (lb) to have BMI = 25: 189.1   Pulse 80 on exam Riding bike before OV  GEN: well developed, well nourished, no acute distress Eyes: conjunctiva and lids normal, PERRLA, EOMI ENT: TM clear, nares clear, oral exam WNL Neck: supple, no lymphadenopathy, no thyromegaly, no JVD Pulm: clear to auscultation and percussion, respiratory effort normal CV: regular rate and rhythm, S1-S2, no murmur, rub or gallop, no bruits, peripheral pulses normal and symmetric, no cyanosis, clubbing, edema or varicosities GI: soft, non-tender; no hepatosplenomegaly, masses; active bowel sounds all quadrants GU: no hernia, testicular mass, penile discharge Lymph: no cervical, axillary or inguinal adenopathy MSK: gait normal, muscle tone and strength WNL, no joint swelling, effusions, discoloration, crepitus  SKIN: clear, good turgor, color WNL, no rashes. Some callus on bottom of feet Neuro: normal mental status, normal strength, sensation, and motion Psych: alert; oriented to person, place and time, normally interactive and not anxious or depressed in appearance.   Assessment and Plan:  1. Routine general medical examination at a health care facility    The patient's preventative maintenance and recommended screening tests for an annual wellness exam were reviewed in full today. Brought up to date unless services  declined.  Counselled on the importance of diet, exercise, and its role in overall health and mortality. The patient's FH and SH was reviewed, including their home life, tobacco status, and drug and alcohol status.   We discussed his hypertriglyceridemia, knees and work on losing weight, and changing his diet, and increases his exercise pattern.  Also gave him some urea cream and recommended using a pumice  stone on his calluses  Orders Today:  No orders of the defined types were placed in this encounter.    Medications Today: (Includes new updates added during medication reconciliation) Meds ordered this encounter  Medications  . urea (CARMOL) 40 % CREA    Sig: Use on feet bid as directed    Dispense:  198.6 each    Refill:  2     Hannah Beat, MD

## 2013-07-22 ENCOUNTER — Telehealth: Payer: Self-pay

## 2013-07-22 NOTE — Telephone Encounter (Signed)
yes

## 2013-07-22 NOTE — Telephone Encounter (Signed)
Pt is scheduled on nurse visit schedule for tdap injection; is it OK for pt to get tdap?

## 2013-07-25 ENCOUNTER — Ambulatory Visit (INDEPENDENT_AMBULATORY_CARE_PROVIDER_SITE_OTHER): Payer: 59

## 2013-07-25 DIAGNOSIS — Z23 Encounter for immunization: Secondary | ICD-10-CM

## 2014-04-17 ENCOUNTER — Ambulatory Visit (INDEPENDENT_AMBULATORY_CARE_PROVIDER_SITE_OTHER): Payer: 59 | Admitting: Family Medicine

## 2014-04-17 ENCOUNTER — Encounter: Payer: Self-pay | Admitting: Family Medicine

## 2014-04-17 VITALS — BP 118/82 | HR 102 | Temp 98.1°F | Ht 72.24 in | Wt 253.2 lb

## 2014-04-17 DIAGNOSIS — W57XXXA Bitten or stung by nonvenomous insect and other nonvenomous arthropods, initial encounter: Principal | ICD-10-CM

## 2014-04-17 DIAGNOSIS — S30860A Insect bite (nonvenomous) of lower back and pelvis, initial encounter: Secondary | ICD-10-CM

## 2014-04-17 DIAGNOSIS — S30863A Insect bite (nonvenomous) of scrotum and testes, initial encounter: Secondary | ICD-10-CM

## 2014-04-17 NOTE — Progress Notes (Signed)
Pre visit review using our clinic review tool, if applicable. No additional management support is needed unless otherwise documented below in the visit note. 

## 2014-04-17 NOTE — Progress Notes (Signed)
   [address] [place] [address] Phone: [phone] Fax: [phone]  Patient ID: Louis ParmaStephen A Marquez MRN: [id number], DOB: 18-Feb-1971, 43 y.o. Date of Encounter: 04/17/2014  Primary Physician:  Hannah BeatSpencer Montrail Mehrer, MD   Chief Complaint: Insect Bite   Subjective:   History of Present Illness:  Louis ParmaStephen A Marquez is a 43 y.o. very pleasant male patient who presents with the following:  DOI 04/14/2014  The patient had a tick bite on his scrotum, and there is a bump - wanted me to evaluate.  Past Medical History, Surgical History, Social History, Family History, Problem List, Medications, and Allergies have been reviewed and updated if relevant.  Review of Systems: ROS: GEN: Acute illness details above GI: Tolerating PO intake GU: maintaining adequate hydration and urination Pulm: No SOB Interactive and getting along well at home.  Otherwise, ROS is as per the HPI.   Objective:   Physical Examination: BP 118/82  Pulse 102  Temp(Src) 98.1 F (36.7 C) (Oral)  Ht 6' 0.24" (1.835 m)  Wt 253 lb 4 oz (114.873 kg)  BMI 34.12 kg/m2   GEN: WDWN, NAD, Non-toxic, Alert & Oriented x 3 HEENT: Atraumatic, Normocephalic.  Ears and Nose: No external deformity. EXTR: No clubbing/cyanosis/edema NEURO: Normal gait.  PSYCH: Normally interactive. Conversant. Not depressed or anxious appearing.  Calm demeanor.    GU: normal male. Inspected attachment under magnification. No retained parts. Small raised area approx 3 mm across.    Laboratory and Imaging Data:  Assessment & Plan:   Tick bite of scrotum, initial encounter  Reassured - call if systemic symptoms, then doxy.  Looks fine now  New Prescriptions   No medications on file   Modified Medications   No medications on file   No orders of the defined types were placed in this encounter.   Follow-up: No Follow-up on file. Unless noted above, the patient is to follow-up if symptoms worsen. Red flags were reviewed with  the patient.  Signed,  Elpidio GaleaSpencer T. Keontre Defino, MD, CAQ Sports Medicine   Discontinued Medications   UREA (CARMOL) 40 % CREA    Use on feet bid as directed   Current Medications at Discharge:   Medication List    Notice As of 04/17/2014 11:59 PM   You have not been prescribed any medications.

## 2014-10-09 ENCOUNTER — Encounter: Payer: Self-pay | Admitting: Family Medicine

## 2015-06-11 ENCOUNTER — Telehealth: Payer: Self-pay | Admitting: Family Medicine

## 2015-06-11 NOTE — Telephone Encounter (Signed)
[facility] Day - Client TELEPHONE ADVICE RECORD   [facility]    --------------------------------------------------------------------------------   Patient Name: Louis Marquez  Gender: Male  DOB: 1971/01/07   Age: 44 Y 2 D  Return Phone Number: [phone] (Primary), [phone] (Secondary)  Address:     City/State/Zip:  [place]     Client [facility] Day - Client  Client Site [facility] - Day  Physician Copland, Spencer   Contact Type Call  Call Type Triage / Clinical  Caller Name Johnanthony Wilden  Relationship To Patient Spouse  Return Phone Number [phone] (Primary)  Chief Complaint Fever (non urgent symptom) (> THREE MONTHS)  Initial Comment Caller states husband has been in bed all day with fever, chills, body aches, decreased appetite  PreDisposition Home Care       Nurse Assessment  Nurse: Logan Bores, RN, Melissa Date/Time (Eastern Time): 06/11/2015 4:48:21 PM  Confirm and document reason for call. If symptomatic, describe symptoms. ---Caller states husband has been in bed all day with fever, chills, body aches, decreased appetite    Has the patient traveled out of the country within the last 30 days? ---Not Applicable    Does the patient require triage? ---Yes    Related visit to physician within the last 2 weeks? ---No    Does the PT have any chronic conditions? (i.e. diabetes, asthma, etc.) ---No           Guidelines          Guideline Title Affirmed Question Affirmed Notes Nurse Date/Time (Eastern Time)  Diarrhea MILD-MODERATE diarrhea (e.g., 1-6 times / day more than normal) (all triage questions negative)    Logan Bores, RN, Melissa 06/11/2015 4:49:46 PM    Disp. Time Lamount Cohen Time) Disposition Final User         06/11/2015 4:56:09 PM Home Care Yes Logan Bores, RN, Efraim Kaufmann            Caller Understands: Yes  Disagree/Comply: Comply       Care Advice Given Per Guideline         HOME CARE: You should be able to treat this at home. REASSURANCE: * Sometimes the cause is an infection caused by a virus ('stomach flu) or a bacteria. Diarrhea is one of the body's way of getting rid of germs. * Certain foods (e.g. dairy products, supplements like Ensure) can also trigger diarrhea. * In some patients, the exact cause is never found. * Staying well-hydrated is the key for adults with diarrhea. From what you have told me, it sounds like you are not severely dehydrated at this point. * Here is some general care advice that should help. * Drink more fluids, at least 8-10 cups daily. One cup equals 8 oz (240 ml). * Avoid caffeinated beverages (Reason: caffeine is mildly dehydrating). * Avoid alcohol beverages (beer, wine, hard liquor). * SPORTS DRINKS: You can also drink a sports drinks (e.g., Gatorade, Powerade) to help treat and prevent dehydration. For it to work best, mix it half and half with water. * WATER: For mild to moderate diarrhea, water is often the best liquid to drink. You should also eat some salty foods (e.g., potato chips, pretzels, saltine crackers). This is important to make sure you are getting enough salt, sugars, and fluids to meet your body's needs. OTC MEDS - Loperamide (Imodium AD): CAUTION - Loperamide (Imodium AD): * DO NOT use if there is a fever over 100.5 F (  38.1 C) or if there is blood or mucus in the stools. * Helps reduce diarrhea, vomiting, and abdominal cramping. OTC MEDS - Bismuth Subsalicylate (e.g., Kaopectate, Pepto-Bismol): * Do not use for more than 2 days. * Adult dosage: two tablets or two tablespoons by mouth every hour (if diarrhea continues) to a maximum of 8 doses in a 24 hour period. * Be certain to wash your hands after using the restroom. CONTAGIOUSNESS: EXPECTED COURSE: Viral diarrhea lasts 4-7 days. Always worse on days 1 and 2. CALL BACK IF: * You become worse. * Diarrhea persists over 7 days * Signs of dehydration occur (e.g., no urine over 12  hours, very dry mouth, lightheaded, etc.) * As the diarrhea starts to get better, you can slowly return to a normal diet. * Begin with boiled starches / cereals (e.g., potatoes, rice, noodles, wheat, oats) with a small amount of salt to taste. * Other foods that are OK include: bananas, yogurt, crackers, soup. FOOD AND NUTRITION DURING MILD-MODERATE DIARRHEA    After Care Instructions Given        Call Event Type User Date / Time Description        --------------------------------------------------------------------------------

## 2017-04-03 ENCOUNTER — Ambulatory Visit (INDEPENDENT_AMBULATORY_CARE_PROVIDER_SITE_OTHER): Payer: 59 | Admitting: Family Medicine

## 2017-04-03 ENCOUNTER — Encounter: Payer: Self-pay | Admitting: Family Medicine

## 2017-04-03 VITALS — BP 102/80 | HR 98 | Temp 98.2°F | Ht 72.0 in | Wt 263.5 lb

## 2017-04-03 DIAGNOSIS — B372 Candidiasis of skin and nail: Secondary | ICD-10-CM | POA: Diagnosis not present

## 2017-04-03 DIAGNOSIS — G4739 Other sleep apnea: Secondary | ICD-10-CM | POA: Diagnosis not present

## 2017-04-03 DIAGNOSIS — Z0000 Encounter for general adult medical examination without abnormal findings: Secondary | ICD-10-CM

## 2017-04-03 DIAGNOSIS — R5383 Other fatigue: Secondary | ICD-10-CM

## 2017-04-03 MED ORDER — FLUCONAZOLE 150 MG PO TABS: ORAL_TABLET | ORAL | 0 refills | Status: DC

## 2017-04-03 NOTE — Progress Notes (Signed)
Dr. Frederico Hamman T. Jajaira Ruis, MD, [facility] [place], [address] Phone: [phone] Fax: [phone]  04/03/2017  Patient: Louis Marquez, MRN: [id number], DOB: [birth year], 46 y.o.  Primary Physician:  Owens Loffler, MD   Chief Complaint  Patient presents with  . Annual Exam   Subjective:   Louis Marquez is a 46 y.o. pleasant patient who presents with the following:  Preventative Health Maintenance Visit:  Health Maintenance Summary Reviewed and updated, unless pt declines services.  His wife raises the question whether he may have sleep apnea, apparently he snores a lot and has apnea episodes while sleeping  Tobacco History Reviewed. Alcohol: No concerns, no excessive use Exercise Habits: Some activity, rec at least 30 mins 5 times a week STD concerns: no risk or activity to increase risk Drug Use: None Encouraged self-testicular check  Health Maintenance  Topic Date Due  . HIV Screening  06/08/1988  . INFLUENZA VACCINE  04/26/2017  . TETANUS/TDAP  07/26/2023   Immunization History  Administered Date(s) Administered  . Influenza,inj,Quad PF,36+ Mos 07/25/2013  . Tdap 07/25/2013   Patient Active Problem List   Diagnosis Date Noted  . OBESITY 08/23/2010  . GASTROESOPHAGEAL REFLUX DISEASE 08/23/2010  . VARICES OF OTHER SITES 06/24/2008   Past Medical History:  Diagnosis Date  . Varicose vein of leg    No past surgical history on file. Social History   Social History  . Marital status: Single    Spouse name: N/A  . Number of children: 0  . Years of education: N/A   Occupational History  . Not on file.   Social History Main Topics  . Smoking status: Never Smoker  . Smokeless tobacco: Never Used  . Alcohol use Yes     Comment: occ  . Drug use: No  . Sexual activity: Not on file   Other Topics Concern  . Not on file   Social History Narrative  . No narrative on file   Family  History  Problem Relation Age of Onset  . Cancer Paternal Grandfather        brain   No Known Allergies  Medication list has been reviewed and updated.   General: Denies fever, chills, sweats. No significant weight loss. Eyes: Denies blurring,significant itching ENT: Denies earache, sore throat, and hoarseness. Cardiovascular: Denies chest pains, palpitations, dyspnea on exertion Respiratory: Denies cough, dyspnea at rest,wheeezing Breast: no concerns about lumps GI: Denies nausea, vomiting, diarrhea, constipation, change in bowel habits, abdominal pain, melena, hematochezia GU: Denies penile discharge, ED, urinary flow / outflow problems. No STD concerns. Musculoskeletal: Denies back pain, joint pain Derm: groin rash x 2 mo Neuro: Denies  paresthesias, frequent falls, frequent headaches Psych: Denies depression, anxiety Endocrine: Denies cold intolerance, heat intolerance, polydipsia Heme: Denies enlarged lymph nodes Allergy: No hayfever  Objective:   BP 102/80   Pulse 98   Temp 98.2 F (36.8 C) (Oral)   Ht 6' (1.829 m)   Wt 263 lb 8 oz (119.5 kg)   BMI 35.74 kg/m  Ideal Body Weight: Weight in (lb) to have BMI = 25: 183.9  No exam data present  GEN: well developed, well nourished, no acute distress Eyes: conjunctiva and lids normal, PERRLA, EOMI ENT: TM clear, nares clear, oral exam WNL Neck: supple, no lymphadenopathy, no thyromegaly, no JVD Pulm: clear to auscultation and percussion, respiratory effort normal CV: regular rate and rhythm, S1-S2, no murmur, rub or gallop, no  bruits, peripheral pulses normal and symmetric, no cyanosis, clubbing, edema or varicosities GI: soft, non-tender; no hepatosplenomegaly, masses; active bowel sounds all quadrants GU: no hernia, testicular mass, penile discharge Lymph: no cervical, axillary or inguinal adenopathy MSK: gait normal, muscle tone and strength WNL, no joint swelling, effusions, discoloration, crepitus  SKIN: flat  groin candida Neuro: normal mental status, normal strength, sensation, and motion Psych: alert; oriented to person, place and time, normally interactive and not anxious or depressed in appearance. All labs reviewed with patient.  Lipids:    Component Value Date/Time   CHOL 213 (H) 04/25/2012 0921   TRIG (H) 04/25/2012 0921    458.0 Triglyceride is over 400; calculations on Lipids are invalid.   HDL 48.30 04/25/2012 0921   LDLDIRECT 89.1 04/25/2012 0921   VLDL 91.6 (H) 04/25/2012 0921   CHOLHDL 4 04/25/2012 0921   CBC: CBC Latest Ref Rng & Units 04/25/2012  WBC 4.5 - 10.5 K/uL 7.6  Hemoglobin 13.0 - 17.0 g/dL 15.5  Hematocrit 39.0 - 52.0 % 45.4  Platelets 150.0 - 400.0 K/uL 678.9    Basic Metabolic Panel:    Component Value Date/Time   NA 140 04/25/2012 0921   K 4.2 04/25/2012 0921   CL 103 04/25/2012 0921   CO2 29 04/25/2012 0921   BUN 13 04/25/2012 0921   CREATININE 0.9 04/25/2012 0921   GLUCOSE 93 04/25/2012 0921   CALCIUM 9.5 04/25/2012 0921   Hepatic Function Latest Ref Rng & Units 04/25/2012  Total Protein 6.0 - 8.3 g/dL 6.8  Albumin 3.5 - 5.2 g/dL 4.0  AST 0 - 37 U/L 25  ALT 0 - 53 U/L 26  Alk Phosphatase 39 - 117 U/L 63  Total Bilirubin 0.3 - 1.2 mg/dL 0.7  Bilirubin, Direct 0.0 - 0.3 mg/dL 0.1    Lab Results  Component Value Date   TSH 3.60 04/25/2012   No results found for: PSA  Assessment and Plan:   Healthcare maintenance - Plan: Basic metabolic panel, CBC with Differential/Platelet, Hepatic function panel, Lipid panel, Hemoglobin A1c, TSH  Other fatigue - Plan: Vitamin B12, Hemoglobin A1c  Other sleep apnea - Plan: Ambulatory referral to Pulmonology  Candida infection of flexural skin  Possible sleep apnea, consult sleep medicine.  Witnessed apneas per the patient.  Diflucan for candida  Health Maintenance Exam: The patient's preventative maintenance and recommended screening tests for an annual wellness exam were reviewed in full  today. Brought up to date unless services declined.  Counselled on the importance of diet, exercise, and its role in overall health and mortality. The patient's FH and SH was reviewed, including their home life, tobacco status, and drug and alcohol status.  Follow-up in 1 year for physical exam or additional follow-up below.  Follow-up: No Follow-up on file. Or follow-up in 1 year if not noted.  Meds ordered this encounter  Medications  . omeprazole (PRILOSEC) 20 MG capsule  . fluconazole (DIFLUCAN) 150 MG tablet    Sig: 1 tab po now then repeat 1 tab in 3 days    Dispense:  2 tablet    Refill:  0   There are no discontinued medications. Orders Placed This Encounter  Procedures  . Vitamin B12  . Basic metabolic panel  . CBC with Differential/Platelet  . Hepatic function panel  . Lipid panel  . Hemoglobin A1c  . TSH  . Ambulatory referral to Pulmonology    Signed,  Frederico Hamman T. Presli Fanguy, MD   Allergies as of 04/03/2017  No Known Allergies     Medication List       Accurate as of 04/03/17  6:27 PM. Always use your most recent med list.          fluconazole 150 MG tablet Commonly known as:  DIFLUCAN 1 tab po now then repeat 1 tab in 3 days   omeprazole 20 MG capsule Commonly known as:  PRILOSEC

## 2017-04-03 NOTE — Patient Instructions (Signed)

## 2017-04-04 LAB — HEMOGLOBIN A1C: HEMOGLOBIN A1C: 5.4 % (ref 4.6–6.5)

## 2017-04-04 LAB — CBC WITH DIFFERENTIAL/PLATELET
Basophils Absolute: 0.1 10*3/uL (ref 0.0–0.1)
Basophils Relative: 0.8 % (ref 0.0–3.0)
EOS ABS: 0.1 10*3/uL (ref 0.0–0.7)
Eosinophils Relative: 1.4 % (ref 0.0–5.0)
HCT: 45.2 % (ref 39.0–52.0)
HEMOGLOBIN: 15.6 g/dL (ref 13.0–17.0)
Lymphocytes Relative: 43.7 % (ref 12.0–46.0)
Lymphs Abs: 3.7 10*3/uL (ref 0.7–4.0)
MCHC: 34.5 g/dL (ref 30.0–36.0)
MCV: 89.4 fl (ref 78.0–100.0)
MONO ABS: 0.6 10*3/uL (ref 0.1–1.0)
Monocytes Relative: 7.1 % (ref 3.0–12.0)
Neutro Abs: 3.9 10*3/uL (ref 1.4–7.7)
Neutrophils Relative %: 47 % (ref 43.0–77.0)
Platelets: 245 10*3/uL (ref 150.0–400.0)
RBC: 5.06 Mil/uL (ref 4.22–5.81)
RDW: 13.5 % (ref 11.5–15.5)
WBC: 8.4 10*3/uL (ref 4.0–10.5)

## 2017-04-04 LAB — HEPATIC FUNCTION PANEL
ALK PHOS: 66 U/L (ref 39–117)
ALT: 33 U/L (ref 0–53)
AST: 29 U/L (ref 0–37)
Albumin: 4.2 g/dL (ref 3.5–5.2)
BILIRUBIN DIRECT: 0 mg/dL (ref 0.0–0.3)
BILIRUBIN TOTAL: 0.5 mg/dL (ref 0.2–1.2)
TOTAL PROTEIN: 7.3 g/dL (ref 6.0–8.3)

## 2017-04-04 LAB — BASIC METABOLIC PANEL
BUN: 14 mg/dL (ref 6–23)
CO2: 24 mEq/L (ref 19–32)
CREATININE: 1.04 mg/dL (ref 0.40–1.50)
Calcium: 9.3 mg/dL (ref 8.4–10.5)
Chloride: 102 mEq/L (ref 96–112)
GFR: 82.53 mL/min (ref >60.00)
Glucose, Bld: 99 mg/dL (ref 70–99)
Potassium: 4.1 mEq/L (ref 3.5–5.1)
Sodium: 137 mEq/L (ref 135–145)

## 2017-04-04 LAB — LIPID PANEL
Cholesterol: 195 mg/dL (ref 0–200)
HDL: 41.8 mg/dL (ref >39.00)
Total CHOL/HDL Ratio: 5

## 2017-04-04 LAB — LDL CHOLESTEROL, DIRECT: LDL DIRECT: 89 mg/dL

## 2017-04-04 LAB — TSH: TSH: 4.34 u[IU]/mL (ref 0.35–4.50)

## 2017-04-04 LAB — VITAMIN B12: Vitamin B-12: 268 pg/mL (ref 211–911)

## 2017-05-15 ENCOUNTER — Institutional Professional Consult (permissible substitution): Payer: 59 | Admitting: Internal Medicine

## 2017-05-23 ENCOUNTER — Ambulatory Visit (INDEPENDENT_AMBULATORY_CARE_PROVIDER_SITE_OTHER): Payer: 59 | Admitting: Pulmonary Disease

## 2017-05-23 ENCOUNTER — Encounter: Payer: Self-pay | Admitting: Pulmonary Disease

## 2017-05-23 VITALS — BP 124/84 | HR 83 | Resp 16 | Ht 72.0 in | Wt 257.0 lb

## 2017-05-23 DIAGNOSIS — R0683 Snoring: Secondary | ICD-10-CM

## 2017-05-23 DIAGNOSIS — J310 Chronic rhinitis: Secondary | ICD-10-CM

## 2017-05-23 DIAGNOSIS — G4719 Other hypersomnia: Secondary | ICD-10-CM

## 2017-05-23 MED ORDER — FLUTICASONE PROPIONATE 50 MCG/ACT NA SUSP: 2.0000 | Freq: Every day | NASAL | 2 refills | Status: DC

## 2017-05-23 NOTE — Progress Notes (Signed)
PULMONARY/SLEEP CONSULT NOTE  Requesting MD/Service: Dallas Schimke Date of initial consultation: 05/23/17 Reason for consultation: Snoring, fatigue  PT PROFILE: 46 y.o. male ever smoker referred for evaluation of snoring, witnessed apneas and generalized fatigue  HPI:  As above. He reports snoring for the past 6-8 years associated with weight gain of approximately 30 pounds. His heart is worse in the supine position. His wife will sometimes shake him awake to get him to start breathing. He has chronic nasal congestion. He denies hypertension and erectile dysfunction. He feels fatigued throughout the day. He has trouble putting the word "sleepy" on this but just generally feels a lack of energy. He does not awaken with morning headache but does not feel refreshed on awakening.  Past Medical History:  Diagnosis Date  . Varicose vein of leg     History reviewed. No pertinent surgical history.  MEDICATIONS: I have reviewed all medications and confirmed regimen as documented  Social History   Social History  . Marital status: Single    Spouse name: N/A  . Number of children: 0  . Years of education: N/A   Occupational History  . Not on file.   Social History Main Topics  . Smoking status: Never Smoker  . Smokeless tobacco: Never Used  . Alcohol use Yes     Comment: occ  . Drug use: No  . Sexual activity: Not on file   Other Topics Concern  . Not on file   Social History Narrative  . No narrative on file    Family History  Problem Relation Age of Onset  . Cancer Paternal Grandfather        brain    ROS: No fever, myalgias/arthralgias, unexplained weight loss or weight gain No new focal weakness or sensory deficits No otalgia, hearing loss, visual changes, nasal and sinus symptoms, mouth and throat problems No neck pain or adenopathy No abdominal pain, N/V/D, diarrhea, change in bowel pattern No dysuria, change in urinary pattern   Vitals:   05/23/17 0840 05/23/17  0847  BP:  124/84  Pulse:  83  Resp: 16   SpO2:  98%  Weight: 257 lb (116.6 kg)   Height: 6' (1.829 m)      EXAM:  Gen: WDWN, No overt respiratory distress HEENT: NCAT, sclera white, oropharynx normal, changes of rhinitis Neck: Supple without LAN, thyromegaly, JVD Lungs: breath sounds Full, percussion normal, adventitious sounds: None Cardiovascular: RRR, no murmurs noted Abdomen: Soft, nontender, normal BS Ext: without clubbing, cyanosis, edema Neuro: CNs grossly intact, motor and sensory intact Skin: Limited exam, no lesions noted  DATA:   BMP Latest Ref Rng & Units 04/03/2017 04/25/2012  Glucose 70 - 99 mg/dL 99 93  BUN 6 - 23 mg/dL 14 13  Creatinine 5.94 - 1.50 mg/dL 5.85 0.9  Sodium 929 - 145 mEq/L 137 140  Potassium 3.5 - 5.1 mEq/L 4.1 4.2  Chloride 96 - 112 mEq/L 102 103  CO2 19 - 32 mEq/L 24 29  Calcium 8.4 - 10.5 mg/dL 9.3 9.5    CBC Latest Ref Rng & Units 04/03/2017 04/25/2012  WBC 4.0 - 10.5 K/uL 8.4 7.6  Hemoglobin 13.0 - 17.0 g/dL 24.4 62.8  Hematocrit 63.8 - 52.0 % 45.2 45.4  Platelets 150.0 - 400.0 K/uL 245.0 222.0    CXR:  None available  IMPRESSION:     ICD-10-CM   1. Excessive daytime sleepiness G47.19 Home sleep test  2. Snoring R06.83   3. Chronic rhinitis J31.0    Likely  OSA  PLAN:  Home sleep study ordered Flonase - 2 sprays per nostril daily Depending on the results of the sleep study, we will likely initiate CPAP and arrange follow-up after that   Billy Fischer, MD PCCM service Mobile [phone] Pager [phone] 05/23/2017 9:25 AM

## 2017-05-23 NOTE — Patient Instructions (Signed)
Home sleep study ordered. Depending on results, we will contact you regarding initiation of therapy which will likely involve CPAP therapy. After CPAP initiated, we will schedule follow up

## 2017-06-01 ENCOUNTER — Encounter: Payer: Self-pay | Admitting: Family Medicine

## 2017-06-02 ENCOUNTER — Telehealth: Payer: Self-pay

## 2017-06-02 ENCOUNTER — Other Ambulatory Visit: Payer: Self-pay | Admitting: Family Medicine

## 2017-06-02 MED ORDER — FLUCONAZOLE 150 MG PO TABS: ORAL_TABLET | ORAL | 0 refills | Status: DC

## 2017-06-02 NOTE — Telephone Encounter (Signed)
Pt want to change on pharmacy list [facility] [place] to [facility] [address] Done.

## 2017-06-23 ENCOUNTER — Encounter: Payer: Self-pay | Admitting: Internal Medicine

## 2017-06-23 DIAGNOSIS — G4719 Other hypersomnia: Secondary | ICD-10-CM

## 2017-06-29 DIAGNOSIS — G4733 Obstructive sleep apnea (adult) (pediatric): Secondary | ICD-10-CM | POA: Diagnosis not present

## 2017-06-30 ENCOUNTER — Telehealth: Payer: Self-pay | Admitting: *Deleted

## 2017-06-30 NOTE — Telephone Encounter (Signed)
LMOM for pt to return call for HST results.

## 2017-07-03 NOTE — Telephone Encounter (Signed)
Patient aware of sleep study. Patient does not want orders placed at this time. Patient wants copy of sleep study mailed. He wants to get second opinion and may possibly opt for dental guard instead of cpap. Will mail copy of cpap. Nothing further needed.

## 2017-07-03 NOTE — Telephone Encounter (Signed)
Patient returning call.

## 2017-07-03 NOTE — Telephone Encounter (Signed)
LMOM for pt to return call to inform him that he is positive for OSA. Recommendations are to place on auto CPAP 5-20 cm H2O.

## 2017-07-03 NOTE — Telephone Encounter (Signed)
lmtcb for results of sleep study.

## 2017-07-03 NOTE — Telephone Encounter (Signed)
Pt is returning your call

## 2017-08-15 ENCOUNTER — Ambulatory Visit: Payer: 59 | Admitting: Family Medicine

## 2017-08-15 ENCOUNTER — Encounter: Payer: Self-pay | Admitting: Family Medicine

## 2017-08-15 ENCOUNTER — Other Ambulatory Visit: Payer: Self-pay | Admitting: *Deleted

## 2017-08-15 ENCOUNTER — Other Ambulatory Visit: Payer: Self-pay

## 2017-08-15 VITALS — BP 110/70 | HR 101 | Temp 98.2°F | Ht 72.0 in | Wt 259.5 lb

## 2017-08-15 DIAGNOSIS — J209 Acute bronchitis, unspecified: Secondary | ICD-10-CM

## 2017-08-15 DIAGNOSIS — J208 Acute bronchitis due to other specified organisms: Secondary | ICD-10-CM | POA: Diagnosis not present

## 2017-08-15 MED ORDER — DOXYCYCLINE HYCLATE 100 MG PO TABS: 100.0000 mg | ORAL_TABLET | Freq: Two times a day (BID) | ORAL | 0 refills | Status: AC

## 2017-08-15 NOTE — Progress Notes (Signed)
Dr. Karleen HampshireSpencer T. Benzion Mesta, MD, CAQ Sports Medicine Primary [facility] and Sports Medicine [address] [place], [address] Phone: [phone] Fax: [phone]  08/15/2017  Patient: Louis Marquez, MRN: [id number], DOB: 20-Jan-1971, 46 y.o.  Primary Physician:  Hannah Beatopland, Tyeesha Riker, MD   Chief Complaint  Patient presents with  . URI   Subjective:   Louis Marquez is a 46 y.o. very pleasant male patient who presents with the following:  Patient presents for presents evaluation of myalgias, nasal congestion, productive cough, rhinorrhea , sneezing and sore throat. Symptoms began >2 week ago and are gradually worsening since that time.   17 d of symptoms, while travelling.   [place], prednisone. Still with chest congestion and coughing all the time. Feels a little sore. Tired. Flew yesterday from [place].  Risk Factors:  The patient denies significant nausea, vomitting, diarrhea, rash, diffuse arthralgia or myalgia. They also deny high fever.   Past Medical History, Surgical History, Social History, Family History, Problem List, Medications, and Allergies have been reviewed and updated if relevant.  Patient Active Problem List   Diagnosis Date Noted  . OBESITY 08/23/2010  . GASTROESOPHAGEAL REFLUX DISEASE 08/23/2010  . VARICES OF OTHER SITES 06/24/2008    Past Medical History:  Diagnosis Date  . Varicose vein of leg     History reviewed. No pertinent surgical history.  Social History   Socioeconomic History  . Marital status: Single    Spouse name: Not on file  . Number of children: 0  . Years of education: Not on file  . Highest education level: Not on file  Social Needs  . Financial resource strain: Not on file  . Food insecurity - worry: Not on file  . Food insecurity - inability: Not on file  . Transportation needs - medical: Not on file  . Transportation needs - non-medical: Not on file  Occupational History  . Not on file  Tobacco Use  . Smoking  status: Never Smoker  . Smokeless tobacco: Never Used  Substance and Sexual Activity  . Alcohol use: Yes    Comment: occ  . Drug use: No  . Sexual activity: Not on file  Other Topics Concern  . Not on file  Social History Narrative  . Not on file    Family History  Problem Relation Age of Onset  . Cancer Paternal Grandfather        brain    No Known Allergies  Medication list reviewed and updated in full in [place] [facility].  ROS: GEN: Acute illness details above GI: Tolerating PO intake GU: maintaining adequate hydration and urination Pulm: No SOB Interactive and getting along well at home.  Otherwise, ROS is as per the HPI.  Objective:   BP 110/70   Pulse (!) 101   Temp 98.2 F (36.8 C) (Oral)   Ht 6' (1.829 m)   Wt 259 lb 8 oz (117.7 kg)   SpO2 95%   BMI 35.19 kg/m    GEN: A and O x 3. WDWN. NAD.    ENT: Nose clear, ext NML.  No LAD.  No JVD.  TM's clear. Oropharynx clear.  PULM: Normal WOB, no distress. No crackles, wheezes, rhonchi. CV: RRR, no M/G/R, No rubs, No JVD.   EXT: warm and well-perfused, No c/c/e. PSYCH: Pleasant and conversant.   Laboratory and Imaging Data: No results found.  Assessment and Plan:   Acute bronchitis due to infection  At this point, we reviewed supportive  care. Given the length of time and risk factors, treat with medications below. Cover atypicals  Medical decision making includes all plans, orders, medications, and patient instructions reviewed face to face.   Meds ordered this encounter  Medications  . doxycycline (VIBRA-TABS) 100 MG tablet    Sig: Take 1 tablet (100 mg total) by mouth 2 (two) times daily for 10 days.    Dispense:  20 tablet    Refill:  0     Follow-up: No Follow-up on file.  Signed,  Elpidio GaleaSpencer T. Dorreen Valiente, MD     Medication List        Accurate as of 08/15/17 11:59 PM. Always use your most recent med list.          benzonatate 200 MG capsule Commonly known as:  TESSALON     doxycycline 100 MG tablet Commonly known as:  VIBRA-TABS Take 1 tablet (100 mg total) by mouth 2 (two) times daily for 10 days.   fluticasone 50 MCG/ACT nasal spray Commonly known as:  FLONASE Place 2 sprays into both nostrils daily.   omeprazole 20 MG capsule Commonly known as:  PRILOSEC       Where to Get Your Medications    [facility] [facility] [place] - [address]  [address], [place] [address]   Phone:  [phone]   doxycycline 100 MG tablet

## 2017-08-22 ENCOUNTER — Ambulatory Visit: Payer: 59 | Admitting: Pulmonary Disease

## 2017-09-04 ENCOUNTER — Ambulatory Visit: Payer: 59 | Admitting: Pulmonary Disease

## 2017-10-16 ENCOUNTER — Ambulatory Visit: Payer: 59 | Admitting: Pulmonary Disease

## 2017-11-20 ENCOUNTER — Ambulatory Visit: Payer: 59 | Admitting: Pulmonary Disease

## 2017-12-22 ENCOUNTER — Encounter: Payer: Self-pay | Admitting: Podiatry

## 2017-12-22 ENCOUNTER — Ambulatory Visit: Payer: BLUE CROSS/BLUE SHIELD | Admitting: Podiatry

## 2017-12-22 DIAGNOSIS — B070 Plantar wart: Secondary | ICD-10-CM | POA: Diagnosis not present

## 2017-12-27 NOTE — Progress Notes (Signed)
   Subjective: 47 year old male presenting today as a new patient with a chief complaint of pain to the arch of the left foot secondary to a plantar wart that has been present for the past three weeks. Standing and walking increase the pain. He has not done anything to treat the symptoms. Patient is here for further evaluation and treatment.   Past Medical History:  Diagnosis Date  . Varicose vein of leg     Objective: Physical Exam General: The patient is alert and oriented x3 in no acute distress.  Dermatology: Hyperkeratotic skin lesion noted to the plantar aspect of the left foot approximately 1 cm in diameter. Pinpoint bleeding noted upon debridement. Skin is warm, dry and supple bilateral lower extremities. Negative for open lesions or macerations.  Vascular: Palpable pedal pulses bilaterally. No edema or erythema noted. Capillary refill within normal limits.  Neurological: Epicritic and protective threshold grossly intact bilaterally.   Musculoskeletal Exam: Pain on palpation to the note skin lesion.  Range of motion within normal limits to all pedal and ankle joints bilateral. Muscle strength 5/5 in all groups bilateral.   Assessment: #1 plantar wart left foot #2 pain in left foot   Plan of Care:  #1 Patient was evaluated. #2 Excisional debridement of the plantar wart lesion was performed using a chisel blade. Cantharone was applied and the lesion was dressed with a dry sterile dressing. #3 patient is to return to clinic in 2 weeks.  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   Felecia ShellingBrent M. Evalyn Shultis, DPM [facility]  Dr. Felecia ShellingBrent M. Maryn Freelove, DPM    [address]                                        [place], [place] [address]                Office [phone]  Fax [phone]

## 2017-12-29 ENCOUNTER — Ambulatory Visit: Payer: Self-pay | Admitting: Podiatry

## 2018-01-03 ENCOUNTER — Ambulatory Visit: Payer: Self-pay | Admitting: Podiatry

## 2018-01-09 ENCOUNTER — Ambulatory Visit: Payer: BLUE CROSS/BLUE SHIELD | Admitting: Podiatry

## 2018-01-09 ENCOUNTER — Encounter: Payer: Self-pay | Admitting: Podiatry

## 2018-01-09 DIAGNOSIS — B070 Plantar wart: Secondary | ICD-10-CM | POA: Diagnosis not present

## 2018-01-12 NOTE — Progress Notes (Signed)
   Subjective: 47 year old male presenting today for follow up evaluation of a plantar wart of the left foot. He states the area is now blistered and the pain has improved. Patient is here for further evaluation and treatment.   Past Medical History:  Diagnosis Date  . Varicose vein of leg     Objective: Physical Exam General: The patient is alert and oriented x3 in no acute distress.  Dermatology: Hyperkeratotic skin lesion noted to the plantar aspect of the left foot approximately 1 cm in diameter. Pinpoint bleeding noted upon debridement. Skin is warm, dry and supple bilateral lower extremities. Negative for open lesions or macerations.  Vascular: Palpable pedal pulses bilaterally. No edema or erythema noted. Capillary refill within normal limits.  Neurological: Epicritic and protective threshold grossly intact bilaterally.   Musculoskeletal Exam: Pain on palpation to the note skin lesion.  Range of motion within normal limits to all pedal and ankle joints bilateral. Muscle strength 5/5 in all groups bilateral.   Assessment: #1 plantar wart left foot - improved   Plan of Care:  #1 Patient was evaluated. #2 Excisional debridement of the plantar wart lesion was performed using a chisel blade. Salicylic acid was applied and the lesion was dressed with a dry sterile dressing. #3 Recommended OTC Dr. Margart SicklesScholl's wart remover.  #4 patient is to return to clinic as needed.  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   Felecia ShellingBrent M. Sagal Gayton, DPM [facility]  Dr. Felecia ShellingBrent M. Mylah Baynes, DPM    [address]                                        [place], [place] [address]                Office [phone]  Fax [phone]

## 2018-05-07 ENCOUNTER — Other Ambulatory Visit: Payer: Self-pay | Admitting: Podiatry

## 2018-05-07 ENCOUNTER — Encounter: Payer: Self-pay | Admitting: Podiatry

## 2018-05-07 ENCOUNTER — Ambulatory Visit (INDEPENDENT_AMBULATORY_CARE_PROVIDER_SITE_OTHER): Payer: BLUE CROSS/BLUE SHIELD

## 2018-05-07 ENCOUNTER — Telehealth: Payer: Self-pay

## 2018-05-07 ENCOUNTER — Ambulatory Visit: Payer: BLUE CROSS/BLUE SHIELD | Admitting: Podiatry

## 2018-05-07 VITALS — BP 127/82 | HR 103

## 2018-05-07 DIAGNOSIS — M7671 Peroneal tendinitis, right leg: Secondary | ICD-10-CM | POA: Diagnosis not present

## 2018-05-07 DIAGNOSIS — T1490XA Injury, unspecified, initial encounter: Secondary | ICD-10-CM

## 2018-05-07 NOTE — Telephone Encounter (Signed)
Auth # [id number].  Please ignore last Auth #

## 2018-05-07 NOTE — Progress Notes (Signed)
His patient presents  to the office stating he was playing tennis yesterday and he heard a pop in his right ankle.  He says that it became painful and swollen.  He states that he now walks with a limp due to the pain.  He points to the area along the outside of his right rear foot as the site of pain.  He also has pain that has developed at the midfoot, right foot.  He has taken Tylenol and he presents the office today for an evaluation of this pop.  He has a history of previous foot surgery where his Achilles tendon was repaired.    General Appearance  Alert, conversant and in no acute stress.  Vascular  Dorsalis pedis and posterior tibial  pulses are palpable  bilaterally.  Capillary return is within normal limits  bilaterally. Temperature is within normal limits  bilaterally.  Neurologic  Senn-Weinstein monofilament wire test within normal limits  bilaterally. Muscle power within normal limits bilaterally.  Nails Normal nails noted with no evidence of bacterial or fungal infection.  Orthopedic  Decreased ankle motion dorsally.  Pain along the course of the peroneal tendon and at base fifth metatarsal right foot.  Weakness of peroneal tendon noted relative left foot. Marland Kitchen.  No crepitus or effusions noted.  No bony pathology or digital deformities noted.  Skin  normotropic skin with no porokeratosis noted bilaterally.  No signs of infections or ulcers noted.    Peroneal tendon right foot    IE  X-ray of the foot and ankle reveal bony osteophytes noted at the level of the right ankle. No evidence of displaced fracture noted to the right ankle.  Discussed this condition with this patient.  Informed him that I will need to acquire an MRI for an evaluation of the peroneal tendon.  This appointment will be made.  In the meantime, he is [place] to [place] to [place]  .  He was told to wear his Cam Dan HumphreysWalker that he has from his previous surgery.  He is to take ibuprofen by mouth and ice his foot.  He was  also given a compression ankle to wear on his  right foot.  Patient to be evaluated by Dr. Al CorpusHyatt once the MRI results are received.   Helane GuntherGregory Shelitha Magley DPM.

## 2018-05-07 NOTE — Telephone Encounter (Signed)
Per Michelle PiperGuy with [employer], MRI has been approved from 05/07/18 to 06/05/18  Auth # [id number] Patient has been notified via voice mail and will call to schedule his own appt.

## 2018-05-07 NOTE — Telephone Encounter (Signed)
This encounter was created in error - please disregard.

## 2018-05-07 NOTE — Addendum Note (Signed)
Addended by: Geraldine ContrasVENABLE, ANGELA D on: 05/07/2018 04:28 PM   Modules accepted: Orders

## 2018-05-18 ENCOUNTER — Ambulatory Visit
Admission: RE | Admit: 2018-05-18 | Discharge: 2018-05-18 | Disposition: A | Discharge status: Home / Self Care | Payer: BLUE CROSS/BLUE SHIELD | Source: Ambulatory Visit | Attending: Podiatry | Admitting: Podiatry

## 2018-05-18 DIAGNOSIS — S93491S Sprain of other ligament of right ankle, sequela: Secondary | ICD-10-CM | POA: Diagnosis not present

## 2018-05-18 DIAGNOSIS — M7671 Peroneal tendinitis, right leg: Secondary | ICD-10-CM | POA: Insufficient documentation

## 2018-06-12 ENCOUNTER — Telehealth: Payer: Self-pay | Admitting: Podiatry

## 2018-06-12 NOTE — Telephone Encounter (Signed)
Pt is wondering about MRI results. No one has called him since the MRI was done. Please call him.

## 2018-06-12 NOTE — Telephone Encounter (Signed)
I returned patient call and informed him that his results were back and that he would need an appt to discuss results with Dr. Al CorpusHyatt.  He was very upset that no one had called with results yet.  He wanted a doctor to call and discuss because he read report through his my-chart and doesn't know if he will need surgery or not.  I informed him that I would have a physician contact him to discuss and apologized for the delay.  He verbalized understanding.

## 2018-07-19 ENCOUNTER — Other Ambulatory Visit: Payer: Self-pay | Admitting: Podiatry

## 2018-07-19 ENCOUNTER — Ambulatory Visit: Payer: BLUE CROSS/BLUE SHIELD | Admitting: Podiatry

## 2018-07-19 ENCOUNTER — Ambulatory Visit (INDEPENDENT_AMBULATORY_CARE_PROVIDER_SITE_OTHER): Payer: BLUE CROSS/BLUE SHIELD

## 2018-07-19 DIAGNOSIS — S99911A Unspecified injury of right ankle, initial encounter: Secondary | ICD-10-CM

## 2018-07-19 DIAGNOSIS — S96919A Strain of unspecified muscle and tendon at ankle and foot level, unspecified foot, initial encounter: Secondary | ICD-10-CM

## 2018-07-19 DIAGNOSIS — S99921A Unspecified injury of right foot, initial encounter: Secondary | ICD-10-CM

## 2018-07-19 MED ORDER — MELOXICAM 15 MG PO TABS: 15.0000 mg | ORAL_TABLET | Freq: Every day | ORAL | 0 refills | Status: DC

## 2018-07-22 NOTE — Progress Notes (Signed)
Subjective: 47 year old male presents today for concerns of acute pain to his right ankle.  He states he felt a pop while playing tennis and had pain yesterday.  He states that he had this previously had an MRI performed.  He has not been to the office for follow-up since this MRI was done.  He states he is also never been told the results of the MRI.  He had some mild swelling since the injury while playing tennis. Denies any systemic complaints such as fevers, chills, nausea, vomiting. No acute changes since last appointment, and no other complaints at this time.   Objective: AAO x3, NAD DP/PT pulses palpable bilaterally, CRT less than 3 seconds There is tenderness in the course the peroneal tendons inferior to the lateral malleolus there is mild swelling to this area.  Overall the peroneal tendons appear to be intact.  Flexor, extensor tendons intact.  Achilles tendon appears to be intact.  Plantar flexion intact.  There is no area pinpoint tenderness. No open lesions or pre-ulcerative lesions.  No pain with calf compression, swelling, warmth, erythema  Assessment:  Plan: -All treatment options discussed with the patient including all alternatives, risks, complications.  -X-rays were obtained which include no definitive evidence of acute fracture.  There is calcifications present on the inferior portion of the calcaneus. -I reviewed the MRI results with him previously.  Given the subcu injuries in this area I discussed with conservative as well as surgical treatment options.  Discussed with him surgical repair of the peroneal tendon.  In the meantime we are going to immobilize in a cam boot which he states that he has at home and they do not want to get a new one today.  Continue ice elevate.  Anti-inflammatories as needed.  He is going to consider surgery.  We will plan to see how he does.  He is going to follow-up with Dr. Al Corpus in [place].  He previously did surgery on him and is closer to  that office.  I am more than happy to see him as needed. -Patient encouraged to call the office with any questions, concerns, change in symptoms.   Vivi Barrack DPM

## 2018-08-06 ENCOUNTER — Ambulatory Visit (INDEPENDENT_AMBULATORY_CARE_PROVIDER_SITE_OTHER): Payer: BLUE CROSS/BLUE SHIELD | Admitting: Podiatry

## 2018-08-06 ENCOUNTER — Encounter: Payer: Self-pay | Admitting: Podiatry

## 2018-08-06 DIAGNOSIS — M7671 Peroneal tendinitis, right leg: Secondary | ICD-10-CM

## 2018-08-06 MED ORDER — METHYLPREDNISOLONE 4 MG PO TBPK: ORAL_TABLET | ORAL | 0 refills | Status: DC

## 2018-08-06 MED ORDER — CELECOXIB 200 MG PO CAPS: 200.0000 mg | ORAL_CAPSULE | Freq: Two times a day (BID) | ORAL | 3 refills | Status: DC

## 2018-08-06 NOTE — Progress Notes (Addendum)
He presents today for follow-up of his continued ankle pain to his right foot.  He states that still hurting had an MRI done states that there was a partial tear there.  He wants to know if there is anything else that he can do.  Objective: Vital signs are stable he is alert and oriented x3.  Pulses are palpable.  Neurologic sensorium is intact deep tendon reflexes are intact muscle strength is normal symmetrical.  He has tenderness on palpation of the peroneal tendons just beneath the peroneal tubercle lateral aspect of the right foot.  There is an area that is hard on palpation not only is this the tubercle but right there at that tubercle there is a small area of intratendinous calcification which is noted to be the central aspect of his tear.  There is no ecchymosis mild edema no erythema.  Assessment: Peroneal tendinitis with a partial tear per MRI right foot.  Intratendinous ossification/calcification.  Plan: Discussed etiology pathology conservative versus surgical therapies at this point I placed him on a Medrol Dosepak changed his meloxicam Celebrex provided him with a small dexamethasone injection 2 mg to the point of maximal tenderness making sure not to inject into the tendon itself.  He tolerated procedure well 2 mg of dexamethasone and local anesthetic.  Recommended that he discontinue tennis until this is resolved if he is no better in the next few weeks we will consider surgical intervention.  I do have him set up with [facility] for neutral pair of orthotics.  This needs to be a neutral pair or even something with a lateral wedge.  Any supination at all will cause worse pain on the peroneal side of his foot.

## 2018-08-22 ENCOUNTER — Ambulatory Visit (INDEPENDENT_AMBULATORY_CARE_PROVIDER_SITE_OTHER): Payer: BLUE CROSS/BLUE SHIELD | Admitting: Orthotics

## 2018-08-22 DIAGNOSIS — M7671 Peroneal tendinitis, right leg: Secondary | ICD-10-CM | POA: Diagnosis not present

## 2018-08-22 DIAGNOSIS — M7672 Peroneal tendinitis, left leg: Secondary | ICD-10-CM

## 2018-09-12 ENCOUNTER — Other Ambulatory Visit: Payer: BLUE CROSS/BLUE SHIELD | Admitting: Orthotics

## 2018-10-03 ENCOUNTER — Ambulatory Visit (INDEPENDENT_AMBULATORY_CARE_PROVIDER_SITE_OTHER): Payer: BLUE CROSS/BLUE SHIELD | Admitting: Orthotics

## 2018-10-03 DIAGNOSIS — S99911A Unspecified injury of right ankle, initial encounter: Secondary | ICD-10-CM

## 2018-10-03 DIAGNOSIS — M7671 Peroneal tendinitis, right leg: Secondary | ICD-10-CM

## 2018-10-03 DIAGNOSIS — S96919A Strain of unspecified muscle and tendon at ankle and foot level, unspecified foot, initial encounter: Secondary | ICD-10-CM

## 2018-10-03 NOTE — Progress Notes (Signed)
Patient came in today to pick up custom made foot orthotics.  The goals were accomplished and the patient reported no dissatisfaction with said orthotics.  Patient was advised of breakin period and how to report any issues.  I also added about 4* valgus wedging.

## 2019-03-20 ENCOUNTER — Ambulatory Visit (INDEPENDENT_AMBULATORY_CARE_PROVIDER_SITE_OTHER): Payer: BC Managed Care – PPO | Admitting: Podiatry

## 2019-03-20 ENCOUNTER — Other Ambulatory Visit: Payer: Self-pay | Admitting: Podiatry

## 2019-03-20 ENCOUNTER — Ambulatory Visit (INDEPENDENT_AMBULATORY_CARE_PROVIDER_SITE_OTHER): Payer: BC Managed Care – PPO

## 2019-03-20 ENCOUNTER — Other Ambulatory Visit: Payer: Self-pay

## 2019-03-20 ENCOUNTER — Encounter: Payer: Self-pay | Admitting: Podiatry

## 2019-03-20 VITALS — Temp 98.3°F

## 2019-03-20 DIAGNOSIS — M7671 Peroneal tendinitis, right leg: Secondary | ICD-10-CM | POA: Diagnosis not present

## 2019-03-20 DIAGNOSIS — S9032XA Contusion of left foot, initial encounter: Secondary | ICD-10-CM

## 2019-03-20 DIAGNOSIS — M7672 Peroneal tendinitis, left leg: Secondary | ICD-10-CM

## 2019-03-20 DIAGNOSIS — S86312A Strain of muscle(s) and tendon(s) of peroneal muscle group at lower leg level, left leg, initial encounter: Secondary | ICD-10-CM

## 2019-03-20 DIAGNOSIS — M7750 Other enthesopathy of unspecified foot: Secondary | ICD-10-CM

## 2019-03-20 MED ORDER — METHYLPREDNISOLONE 4 MG PO TBPK: ORAL_TABLET | ORAL | 0 refills | Status: DC

## 2019-03-20 NOTE — Progress Notes (Signed)
  Subjective:  Patient ID: Louis Marquez, male    DOB: 1971-03-19,  MRN: [id number] HPI Chief Complaint  Patient presents with  . Foot Injury    Lateral foot and anterior ankle left - playing tennis last night and felt a pop, wearing boot since    48 y.o. male presents with the above complaint.   ROS: Denies fever chills nausea vomiting muscle aches pains calf pain back pain chest pain shortness of breath.  Past Medical History:  Diagnosis Date  . Varicose vein of leg    No past surgical history on file.  Current Outpatient Medications:  .  methylPREDNISolone (MEDROL DOSEPAK) 4 MG TBPK tablet, 6 day dose pack - take as directed, Disp: 21 tablet, Rfl: 0  No Known Allergies Review of Systems Objective:   Vitals:   03/20/19 1631  Temp: 98.3 F (36.8 C)    General: Well developed, nourished, in no acute distress, alert and oriented x3   Dermatological: Skin is warm, dry and supple bilateral. Nails x 10 are well maintained; remaining integument appears unremarkable at this time. There are no open sores, no preulcerative lesions, no rash or signs of infection present.  Vascular: Dorsalis Pedis artery and Posterior Tibial artery pedal pulses are 2/4 bilateral with immedate capillary fill time. Pedal hair growth present. No varicosities and no lower extremity edema present bilateral.   Neruologic: Grossly intact via light touch bilateral. Vibratory intact via tuning fork bilateral. Protective threshold with Semmes Wienstein monofilament intact to all pedal sites bilateral. Patellar and Achilles deep tendon reflexes 2+ bilateral. No Babinski or clonus noted bilateral.   Musculoskeletal: No gross boney pedal deformities bilateral. No pain, crepitus, or limitation noted with foot and ankle range of motion bilateral. Muscular strength 5/5 in all groups tested bilateral.  He has pain on palpation of the Perrone is longus at the arcuate portion of the cuboid.  He also has pain on  palpation of the plantar aspect of the cuboid.  He also has an inability to perform abduction and plantarflexion.  Gait: Unassisted, Nonantalgic.    Radiographs:  Radiographs taken today demonstrate calcification of the Perrone is longus which appears to be proximally oriented from where it should be.  No acute fractures are identified.    Assessment & Plan:   Assessment: Promius longus tear or strain.  Plan: Discussed etiology pathology conservative surgical therapies at this point time dispensed L 1906, Medrol Dosepak, compression anklet and he will continue to wear his cam walker until this feels better.  We are going go ahead and request an MRI of his peroneus longus rear foot.  I will notify him as to the results once this arrives.     Drianna Chandran T. Tab, [place]

## 2019-03-21 ENCOUNTER — Telehealth: Payer: Self-pay

## 2019-03-21 NOTE — Addendum Note (Signed)
Addended by: Graceann Congress D on: 03/21/2019 10:15 AM   Modules accepted: Orders

## 2019-03-21 NOTE — Telephone Encounter (Signed)
-----   Message from Rip Harbour, [facility] sent at 03/20/2019  4:56 PM EDT ----- Regarding: MRI MRI left ankle - evaluate peroneus longus tendon tear left - surgical consideration

## 2019-03-21 NOTE — Telephone Encounter (Signed)
MRI has been approved from 03/21/2019 to 04/19/2019 Auth # [id number] Patient has been notified of approval and will call to set up own appt to his convenience

## 2019-04-08 ENCOUNTER — Other Ambulatory Visit: Payer: Self-pay | Admitting: Obstetrics and Gynecology

## 2019-04-08 ENCOUNTER — Telehealth: Payer: Self-pay | Admitting: Obstetrics and Gynecology

## 2019-04-08 DIAGNOSIS — Z31440 Encounter of male for testing for genetic disease carrier status for procreative management: Secondary | ICD-10-CM

## 2019-04-08 NOTE — Telephone Encounter (Signed)
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  He is aware, anytime that is convenient.   Malachy Mood, MD, [facility], [facility] 04/08/2019, 12:58 PM

## 2019-04-08 NOTE — Telephone Encounter (Signed)
Patient is schedule for Lab appointment 7/14 at 9:40

## 2019-04-09 ENCOUNTER — Other Ambulatory Visit: Payer: Self-pay

## 2019-04-09 ENCOUNTER — Other Ambulatory Visit: Payer: BC Managed Care – PPO

## 2019-04-09 DIAGNOSIS — Z31440 Encounter of male for testing for genetic disease carrier status for procreative management: Secondary | ICD-10-CM

## 2019-04-17 ENCOUNTER — Ambulatory Visit
Admission: RE | Admit: 2019-04-17 | Discharge: 2019-04-17 | Disposition: A | Discharge status: Home / Self Care | Payer: BC Managed Care – PPO | Source: Ambulatory Visit | Attending: Podiatry | Admitting: Podiatry

## 2019-04-17 ENCOUNTER — Other Ambulatory Visit: Payer: Self-pay

## 2019-04-17 DIAGNOSIS — S86312A Strain of muscle(s) and tendon(s) of peroneal muscle group at lower leg level, left leg, initial encounter: Secondary | ICD-10-CM | POA: Diagnosis present

## 2019-04-22 ENCOUNTER — Telehealth: Payer: Self-pay | Admitting: Podiatry

## 2019-04-22 NOTE — Telephone Encounter (Signed)
Called patient to schedule appt for MRI Results/consult. Pt was unable to schedule at the moment and stated he would call back to schedule.

## 2019-04-22 NOTE — Telephone Encounter (Signed)
-----   Message from Andres Ege, RN sent at 04/22/2019 10:37 AM EDT ----- Please assist pt. Louis Marquez ----- Message ----- From: Garrel Ridgel, [place] Sent: 04/18/2019   4:47 PM EDT To: Andres Ege, RN  Needs to be seen for consult.

## 2019-04-24 DIAGNOSIS — S86312D Strain of muscle(s) and tendon(s) of peroneal muscle group at lower leg level, left leg, subsequent encounter: Secondary | ICD-10-CM | POA: Insufficient documentation

## 2019-04-24 LAB — SMN1 COPY NUMBER ANALYSIS (SMA CARRIER SCREENING)

## 2019-10-23 ENCOUNTER — Ambulatory Visit: Payer: BC Managed Care – PPO | Attending: Internal Medicine

## 2019-10-23 DIAGNOSIS — Z20822 Contact with and (suspected) exposure to covid-19: Secondary | ICD-10-CM

## 2019-10-24 LAB — NOVEL CORONAVIRUS, NAA: SARS-CoV-2, NAA: NOT DETECTED

## 2019-10-31 ENCOUNTER — Other Ambulatory Visit: Payer: BC Managed Care – PPO

## 2019-10-31 ENCOUNTER — Ambulatory Visit: Payer: BC Managed Care – PPO | Attending: Internal Medicine

## 2019-10-31 DIAGNOSIS — Z20822 Contact with and (suspected) exposure to covid-19: Secondary | ICD-10-CM

## 2019-11-01 LAB — NOVEL CORONAVIRUS, NAA: SARS-CoV-2, NAA: NOT DETECTED

## 2020-10-11 DIAGNOSIS — Z20822 Contact with and (suspected) exposure to covid-19: Secondary | ICD-10-CM | POA: Diagnosis not present

## 2021-02-18 ENCOUNTER — Other Ambulatory Visit: Payer: Self-pay

## 2021-02-18 ENCOUNTER — Ambulatory Visit (INDEPENDENT_AMBULATORY_CARE_PROVIDER_SITE_OTHER): Payer: BC Managed Care – PPO | Admitting: Urology

## 2021-02-18 ENCOUNTER — Encounter: Payer: Self-pay | Admitting: Urology

## 2021-02-18 VITALS — BP 144/81 | HR 114 | Ht 72.0 in | Wt 255.0 lb

## 2021-02-18 DIAGNOSIS — Z3009 Encounter for other general counseling and advice on contraception: Secondary | ICD-10-CM | POA: Diagnosis not present

## 2021-02-18 NOTE — Progress Notes (Signed)
02/18/2021 2:06 PM   Louis Marquez [birth year] [id number]  Referring provider: Hannah Beat, MD [address] [place],  [place] [address]  Chief Complaint  Patient presents with  . VAS Consult    HPI: Louis Marquez is a 50y.o. male who presents for vasectomy counseling.  . Married with 3 children . Denies prior history urologic problems including chronic scrotal pain, epididymitis or orchitis . No previous history inguinal/pelvic hernia . No history of bleeding or clotting disorders   PMH: Past Medical History:  Diagnosis Date  . Varicose vein of leg     Surgical History: History reviewed. No pertinent surgical history.  Home Medications:  Allergies as of 02/18/2021   No Known Allergies     Medication List       Accurate as of Feb 18, 2021  2:06 PM. If you have any questions, ask your nurse or doctor.        STOP taking these medications   methylPREDNISolone 4 MG Tbpk tablet Commonly known as: MEDROL DOSEPAK Stopped by: Riki Altes, MD     TAKE these medications   omeprazole 20 MG capsule Commonly known as: PRILOSEC Take 20 mg by mouth daily.       Allergies: No Known Allergies  Family History: Family History  Problem Relation Age of Onset  . Cancer Paternal Grandfather        brain    Social History:  reports that he has never smoked. He has never used smokeless tobacco. He reports current alcohol use. He reports that he does not use drugs.   Physical Exam: BP (!) 144/81   Pulse (!) 114   Ht 6' (1.829 m)   Wt 255 lb (115.7 kg)   BMI 34.58 kg/m   Constitutional:  Alert and oriented, No acute distress. HEENT: [place] AT, moist mucus membranes.  Trachea midline, no masses. Cardiovascular: No clubbing, cyanosis, or edema. Respiratory: Normal respiratory effort, no increased work of breathing. GI: Abdomen is soft, nontender, nondistended, no abdominal masses GU: Phallus without lesions, testes descended bilaterally without  masses or tenderness, spermatic cord/epididymis palpably normal bilaterally.  Vasa easily palpable bilaterally Skin: No rashes, bruises or suspicious lesions. Neurologic: Grossly intact, no focal deficits, moving all 4 extremities. Psychiatric: Normal mood and affect.   Assessment & Plan:    1.  Undesired fertility . Desires to schedule vasectomy but needs to check his work schedule and indicated he would call back . We had a long discussion about vasectomy. We specifically discussed the procedure, recovery and the risks, benefits and alternatives of vasectomy. I explained that the procedure entails removal of a segment of each vas deferens, each of which conducts sperm, and that the purpose of this procedure is to cause sterility (inability to produce children or cause pregnancy). Vasectomy is intended to be permanent and irreversible form of contraception. Options for fertility after vasectomy include vasectomy reversal, or sperm retrieval with in vitro fertilization. These options are not always successful, and they may be expensive. We discussed reversible forms of birth control such as condoms, IUD or diaphragms, as well as the option of freezing sperm in a sperm bank prior to the vasectomy procedure. We discussed the importance of avoiding strenuous exercise for four days after vasectomy, and the importance of refraining from any form of ejaculation for seven days after vasectomy. I explained that vasectomy does not produce immediate sterility so another form of contraceptive must be used until sterility is assured by having semen  checked for sperm. Thus, a post vasectomy semen analysis is necessary to confirm sterility. Rarely, vasectomy must be repeated. We discussed the approximately 1 in 2,000 risk of pregnancy after vasectomy for men who have post-vasectomy semen analysis showing absent sperm or rare non-motile sperm. Typical side effects include a small amount of oozing blood, some discomfort  and mild swelling in the area of incision.  Vasectomy does not affect sexual performance, function, please, sensation, interest, desire, satisfaction, penile erection, volume of semen or ejaculation. Other rare risks include allergy or adverse reaction to an anesthetic, testicular atrophy, hematoma, infection/abscess, prolonged tenderness of the vas deferens, pain, swelling, painful nodule or scar (called sperm granuloma) or epididymtis. We discussed chronic testicular pain syndrome. This has been reported to occur in as many as 1-2% of men and may be permanent. This can be treated with medication, small procedures or (rarely) surgery. Marland Kitchen He indicated he would call back if he desires a preprocedure anxiolytic and would need a driver if utilizing   Riki Altes, MD  [facility] [address] [place], [place] [address] [phone]

## 2021-05-18 IMAGING — MR MRI OF THE LEFT ANKLE WITHOUT CONTRAST
5 series · 40 of 40 positions shown · non-contrast
Comparison: Foot radiographs 03/20/2019.

CLINICAL DATA: Left lateral ankle pain for 6 weeks after playing
tennis and hearing a pop.

EXAM:
MRI OF THE LEFT ANKLE WITHOUT CONTRAST
TECHNIQUE: Multiplanar, multisequence MR imaging of the ankle was performed. No
intravenous contrast was administered.

[Series 4: T2 fat-sat · axial · left · 3.0mm · 0.50mm/px · z∈[-91,+48]mm · 10 of 36 slices shown (1 of 2)]
[im 1/36]
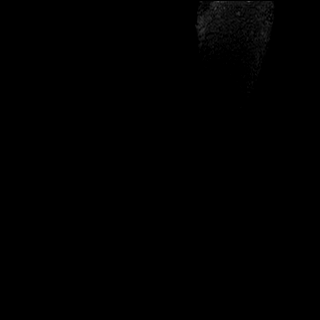
[im 4/36]
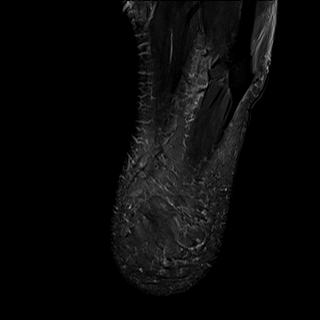
[im 8/36]
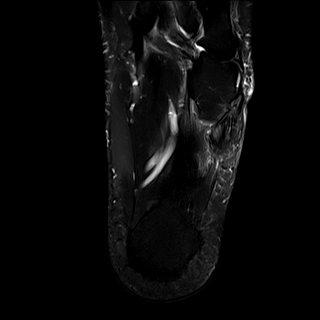
[im 12/36]
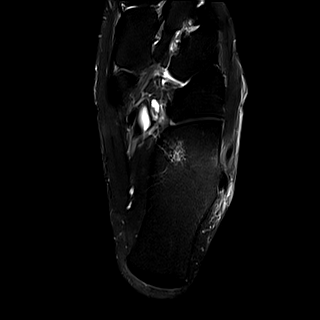
[im 16/36]
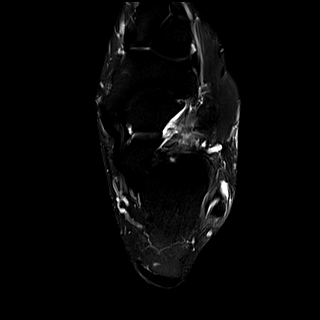
[im 20/36]
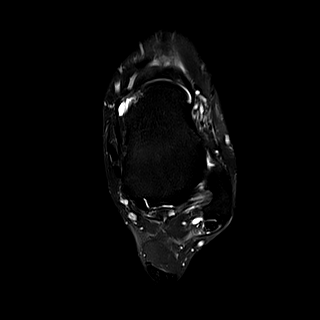
[im 24/36]
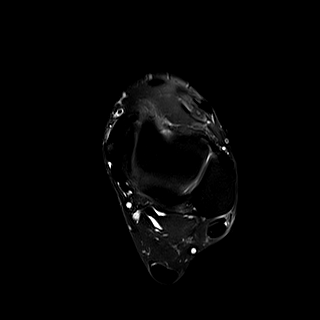
[im 28/36]
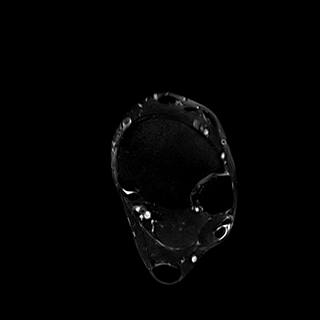
[im 32/36]
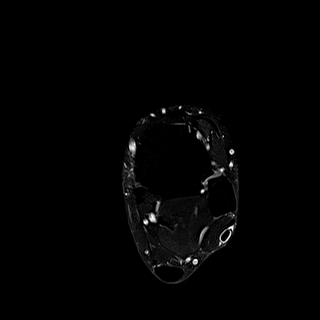
[im 36/36]
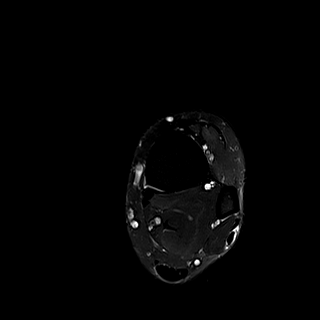

[Series 5: T2 fat-sat · coronal · left · 3.0mm · 0.62mm/px · 11 of 40 slices shown (2 of 2)]
[im 1/40]
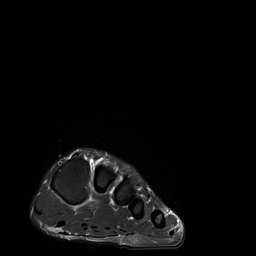
[im 4/40]
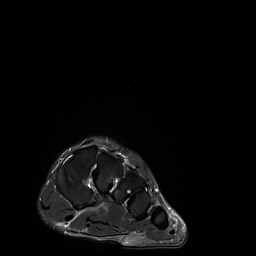
[im 8/40]
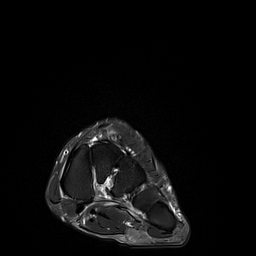
[im 12/40]
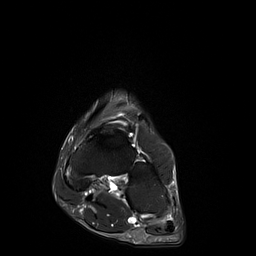
[im 16/40]
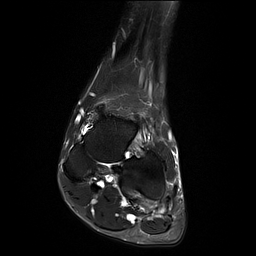
[im 20/40]
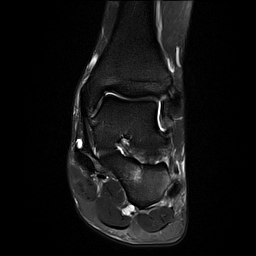
[im 24/40]
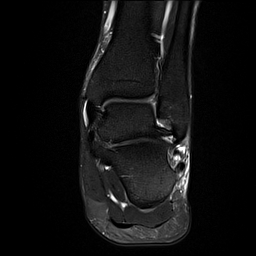
[im 28/40]
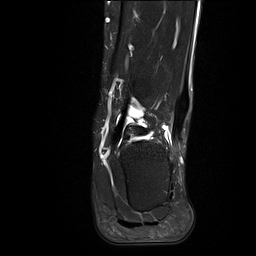
[im 32/40]
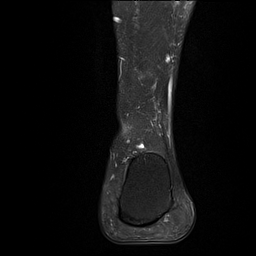
[im 36/40]
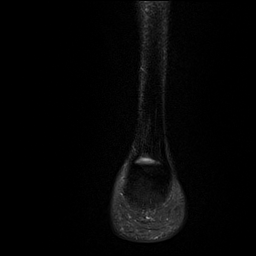
[im 40/40]
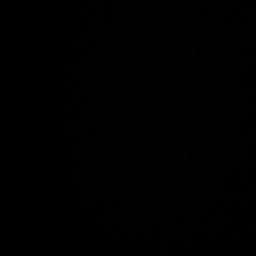

[Series 6: T1 · sagittal · left · 4.0mm · 0.70mm/px · 5 of 21 slices shown]
[im 1/21]
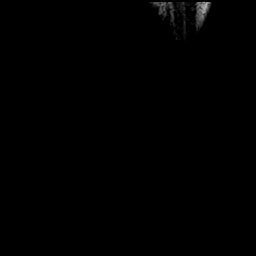
[im 6/21]
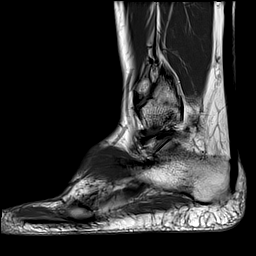
[im 11/21]
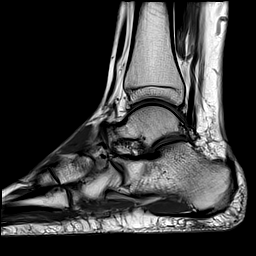
[im 16/21]
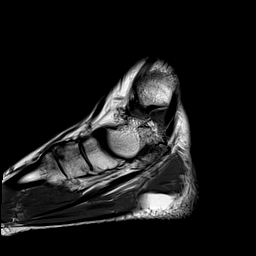
[im 21/21]
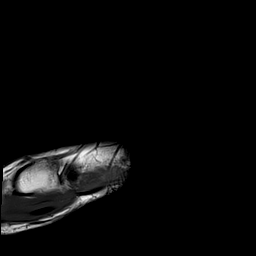

[Series 7: STIR · sagittal · left · 4.0mm · 0.35mm/px · 5 of 21 slices shown]
[im 1/21]
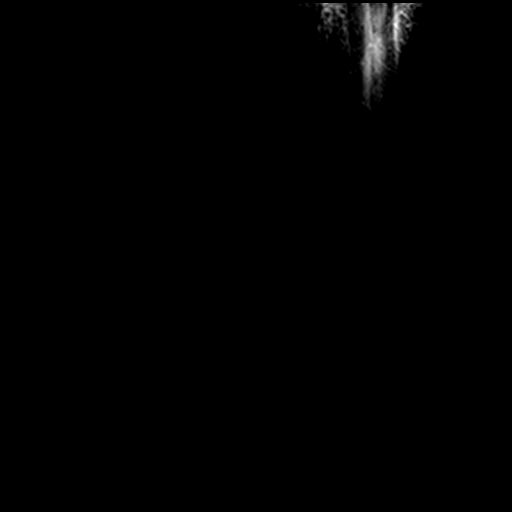
[im 6/21]
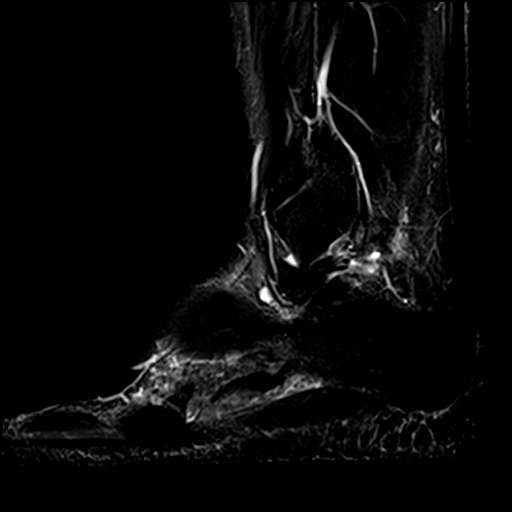
[im 11/21]
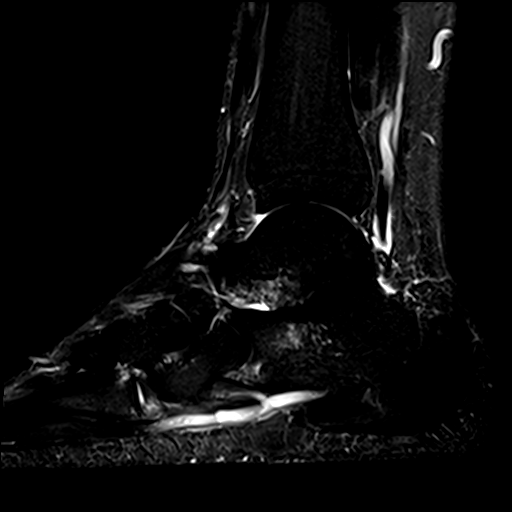
[im 16/21]
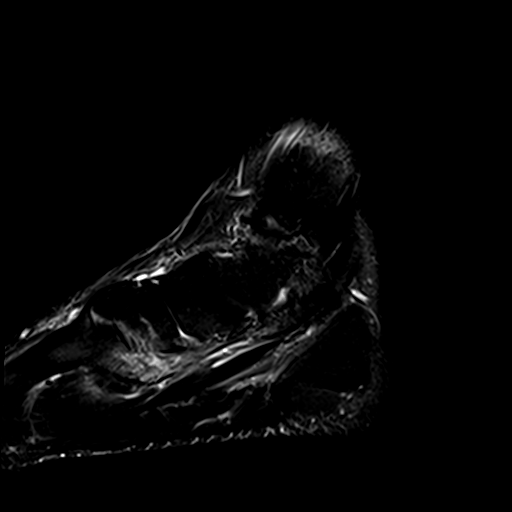
[im 21/21]
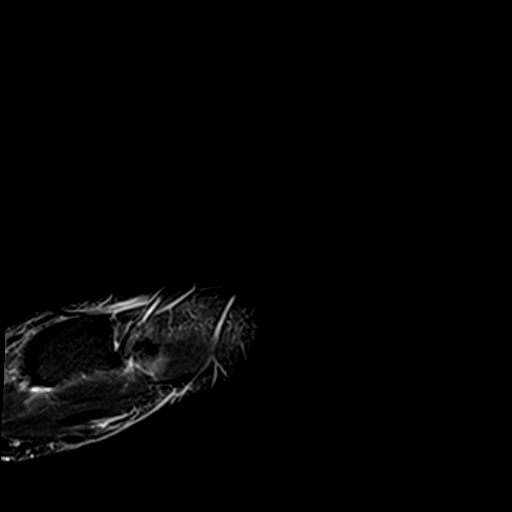

[Series 8: PD fat-sat · axial · left · 3.0mm · 0.50mm/px · z∈[-91,+48]mm · 9 of 36 slices shown]
[im 1/36]
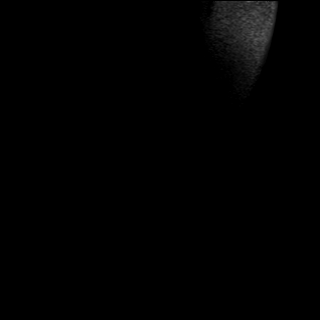
[im 5/36]
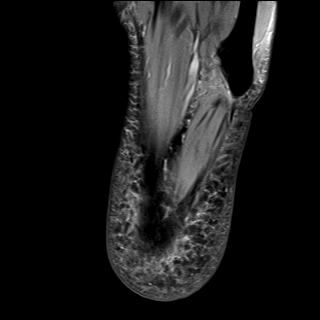
[im 9/36]
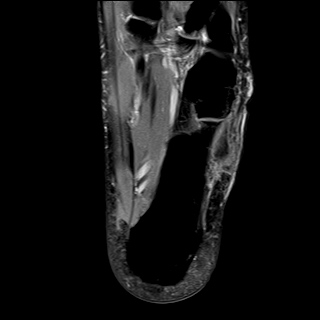
[im 14/36]
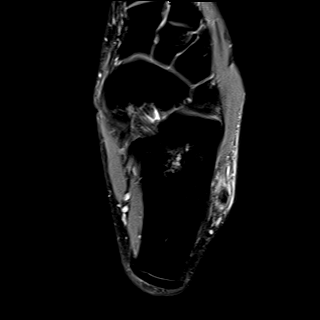
[im 18/36]
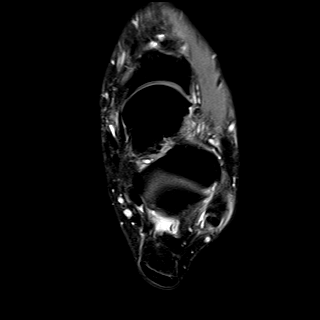
[im 22/36]
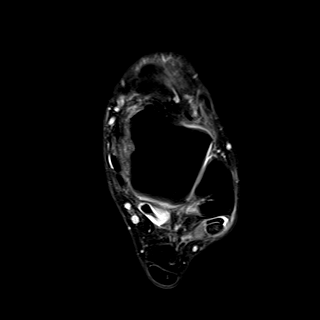
[im 27/36]
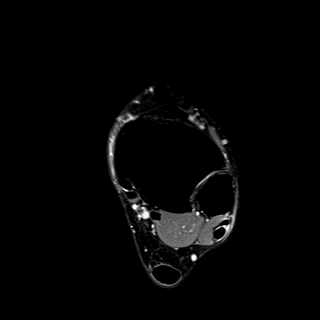
[im 31/36]
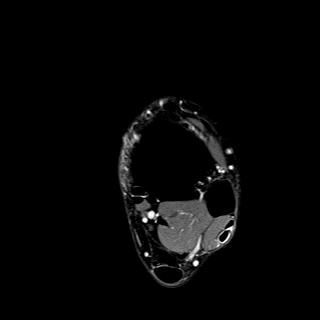
[im 36/36]
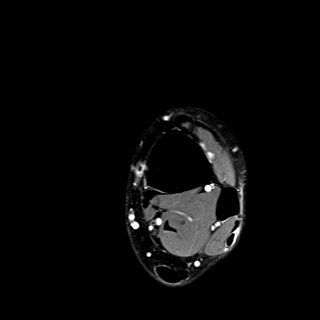

[40 of 40 positions shown; findings below may reference images not displayed]

FINDINGS: TENDONS

Peroneal: There is fusiform dilatation of the peroneus longus tendon
at the level of the distal fibula. The peroneus brevis tendon is
mildly flattened at this level, but intact. More distally, there is
partial tearing of the peroneus longus tendon as it passes around
the inferior aspect of the calcaneocuboid joint. No full-thickness
tear or tendon retraction identified. A small amount of fluid is
present in the peroneal tendon sheath.

Posteromedial: Intact and normally positioned. Type 2 accessory
navicular with minimal subcortical T2 hyperintensity.

Anterior: Intact and normally positioned.

Achilles: Intact.

Plantar Fascia: Intact.

LIGAMENTS

Lateral: The anterior and posterior talofibular and calcaneofibular
ligaments are intact.

Medial: The deltoid and visualized portions of the spring ligament
appear intact.

CARTILAGE AND BONES

Ankle Joint: No significant ankle joint effusion. The talar dome and
tibial plafond are intact.

Subtalar Joints/Sinus Tarsi: The subtalar joint appears normal.
There are small ganglia within the tarsal sinus.

Bones: No significant extra-articular osseous findings. Small
vascular remnant (normal variant) anteriorly in the calcaneus.

Other: Mild lateral subcutaneous edema.
IMPRESSION: 1. Peroneus longus tenosynovitis with partial tearing of the distal
slip of the tendon where it passes along the inferolateral aspect of
calcaneocuboid joint.
2. The additional ankle tendons are intact.
3. No acute osteochondral or ligamentous findings.

## 2021-05-24 ENCOUNTER — Telehealth: Payer: Self-pay | Admitting: Urology

## 2021-05-24 NOTE — Telephone Encounter (Signed)
Patient schedule his vasectomy and is asking for a valium to be called into his pharmacy.   Thanks, Marcelino Duster

## 2021-05-25 MED ORDER — DIAZEPAM 10 MG PO TABS
ORAL_TABLET | ORAL | 0 refills | Status: DC
Start: 2021-05-25 — End: 2021-12-01

## 2021-06-17 ENCOUNTER — Other Ambulatory Visit: Payer: Self-pay

## 2021-06-17 ENCOUNTER — Encounter: Payer: Self-pay | Admitting: Urology

## 2021-06-17 ENCOUNTER — Ambulatory Visit: Payer: BC Managed Care – PPO | Admitting: Urology

## 2021-06-17 VITALS — BP 131/89 | HR 112 | Ht 72.0 in | Wt 260.0 lb

## 2021-06-17 DIAGNOSIS — Z302 Encounter for sterilization: Secondary | ICD-10-CM | POA: Diagnosis not present

## 2021-06-17 DIAGNOSIS — Z9852 Vasectomy status: Secondary | ICD-10-CM

## 2021-06-17 MED ORDER — HYDROCODONE-ACETAMINOPHEN 5-325 MG PO TABS: 1.0000 | ORAL_TABLET | Freq: Four times a day (QID) | ORAL | 0 refills | Status: DC | PRN

## 2021-06-17 NOTE — Progress Notes (Signed)
Vasectomy Procedure Note  Indications: Louis Marquez is a 50 y.o. male who presents today for elective sterilization.  He has been consented for the procedure.  He is aware of the risks and benefits.  He had no additional questions.  He agrees to proceed.  He denies any other significant change since his last visit.  Pre-operative Diagnosis: Elective sterilization  Post-operative Diagnosis: Elective sterilization  Premedication: Valium 10 mg po  Surgeon: Lorin Picket C. Daritza Brees, M.D  Description: The patient was prepped and draped in the standard fashion.  The right vas deferens was identified and brought superiorly to the anterior scrotal skin.  The skin and vas was then anesthetized utilizing 8 ml 1% lidocaine.  A small stab incision was made and spread with the vas dissector.  The vas was grasped utilizing the vas clamp and elevated out of the incision.  The vas was dissected free from surrounding tissue and vessels and an ~1 cm segment was excised.  The vas lumens were cauterized utilizing electrocautery.  The distal segment was buried in the surrounding sheath with a 3-0 chromic suture.  No significant bleeding was observed.  The vas ends were then dropped back into the hemiscrotum.  The skin was closed with hemostatic pressure.  An identical procedure was performed on the contralateral side.  Clean dry gauze was applied to the incision sites.  The patient tolerated the procedure well.  Complications:None  Recommendations: 1.  No lifting greater than 10 pounds or strenuous activity for 1 week. 2.  Scrotal support for 1 week. 3.  Shower only for 1 week; may shower in the morning 4.  May resume intercourse in one week if no significant discomfort.  Continue alternate contraception for 12 weeks.  5.  Call for significant pain, swelling, redness, drainage or fever greater than 100.5. 6.  Rx hydrocodone/APAP 5/325 1-2 every 6 hours as needed for pain. 7.  Follow-up semen analysis in 12  weeks.   Irineo Axon, MD

## 2021-06-17 NOTE — Patient Instructions (Signed)

## 2021-09-13 ENCOUNTER — Other Ambulatory Visit: Payer: Self-pay

## 2021-09-13 ENCOUNTER — Other Ambulatory Visit: Payer: BC Managed Care – PPO

## 2021-09-13 DIAGNOSIS — Z9852 Vasectomy status: Secondary | ICD-10-CM | POA: Diagnosis not present

## 2021-09-14 ENCOUNTER — Encounter: Payer: Self-pay | Admitting: Urology

## 2021-09-14 LAB — POST-VAS SPERM EVALUATION,QUAL: Volume: 1 mL

## 2021-10-06 ENCOUNTER — Telehealth: Payer: Self-pay | Admitting: Family Medicine

## 2021-10-06 NOTE — Telephone Encounter (Signed)
He needs to understand that I am moving my practice much more to my sports medicine subspecialty, and I have less primary care availability than anyone else.  I am also part-time.  Please offer him an appointment with Marlou Sa, or anyone taking new primary care patients.   If he very much wants to see me, then I can work him in.

## 2021-10-06 NOTE — Telephone Encounter (Signed)
Louis Marquez called in and wanted to know if he can reestablish care with Dr. Patsy Lager.

## 2021-10-07 ENCOUNTER — Encounter: Payer: BC Managed Care – PPO | Admitting: Family Medicine

## 2021-10-12 NOTE — Telephone Encounter (Signed)
Called Louis Marquez and [name]

## 2021-11-04 ENCOUNTER — Ambulatory Visit: Payer: BC Managed Care – PPO | Admitting: Family Medicine

## 2021-11-09 ENCOUNTER — Ambulatory Visit: Payer: BC Managed Care – PPO | Admitting: Adult Health

## 2021-12-01 ENCOUNTER — Other Ambulatory Visit: Payer: Self-pay

## 2021-12-01 ENCOUNTER — Ambulatory Visit: Payer: BC Managed Care – PPO | Admitting: Family Medicine

## 2021-12-01 ENCOUNTER — Encounter: Payer: Self-pay | Admitting: Family Medicine

## 2021-12-01 VITALS — BP 106/72 | HR 99 | Ht 73.0 in | Wt 276.0 lb

## 2021-12-01 DIAGNOSIS — Z1159 Encounter for screening for other viral diseases: Secondary | ICD-10-CM

## 2021-12-01 DIAGNOSIS — R5382 Chronic fatigue, unspecified: Secondary | ICD-10-CM

## 2021-12-01 DIAGNOSIS — R7989 Other specified abnormal findings of blood chemistry: Secondary | ICD-10-CM

## 2021-12-01 DIAGNOSIS — Z114 Encounter for screening for human immunodeficiency virus [HIV]: Secondary | ICD-10-CM | POA: Diagnosis not present

## 2021-12-01 DIAGNOSIS — G8929 Other chronic pain: Secondary | ICD-10-CM

## 2021-12-01 DIAGNOSIS — Z13220 Encounter for screening for lipoid disorders: Secondary | ICD-10-CM

## 2021-12-01 DIAGNOSIS — Z0000 Encounter for general adult medical examination without abnormal findings: Secondary | ICD-10-CM | POA: Diagnosis not present

## 2021-12-01 DIAGNOSIS — M25561 Pain in right knee: Secondary | ICD-10-CM

## 2021-12-01 DIAGNOSIS — M25562 Pain in left knee: Secondary | ICD-10-CM

## 2021-12-01 DIAGNOSIS — G4733 Obstructive sleep apnea (adult) (pediatric): Secondary | ICD-10-CM | POA: Insufficient documentation

## 2021-12-01 DIAGNOSIS — Z1211 Encounter for screening for malignant neoplasm of colon: Secondary | ICD-10-CM

## 2021-12-01 NOTE — Assessment & Plan Note (Signed)
Longstanding condition in the setting of untreated obstructive sleep apnea, allergy/snoring, and increased demands with 51-year-old at home.  He has raise concerns over testosterone levels and possible supplementation.  I have placed a referral to his current urologist for further evaluation and management.  Additionally, risk stratification labs have been ordered, will follow them accordingly.  Lastly, referral to ENT/sleep medicine placed for establishment of care, CPAP/mask coordination. ?

## 2021-12-01 NOTE — Progress Notes (Signed)
?  ? ?Annual Physical Exam Visit ? ?Patient Information:  ?Patient ID: Louis Marquez, male DOB: [birth year] Age: 51 y.o. MRN: [id number]  ? ?Subjective:  ? ?CC: Annual Physical Exam ? ?HPI:  ?Louis Marquez is here for their annual physical. ? ?I reviewed the past medical history, family history, social history, surgical history, and allergies today and changes were made as necessary.  Please see the problem list section below for additional details. ? ?Past Medical History: ?Past Medical History:  ?Diagnosis Date  ? Achilles tendon tear, right, initial encounter 2009  ? Allergy   ? GERD (gastroesophageal reflux disease)   ? Varicose vein of leg   ? right leg s/p removal  ? ?Past Surgical History: ?Past Surgical History:  ?Procedure Laterality Date  ? ACHILLES TENDON REPAIR Right 2009  ? ?Family History: ?Family History  ?Problem Relation Age of Onset  ? Heart murmur Mother   ? Allergies Father   ? Atrial fibrillation Father   ? Varicose Veins Father   ? Liver cancer Father   ? Brain cancer Paternal Grandfather   ? ?Allergies: ?No Known Allergies ?Health Maintenance: ?Health Maintenance  ?Topic Date Due  ? Hepatitis C Screening  Never done  ? COLONOSCOPY (Pts 45-62yrs Insurance coverage will need to be confirmed)  Never done  ? INFLUENZA VACCINE  12/24/2021 (Originally 04/26/2021)  ? TETANUS/TDAP  07/26/2023  ? COVID-19 Vaccine  Completed  ? HIV Screening  Completed  ? HPV VACCINES  Aged Out  ?  ?HM Colonoscopy        ? ? Overdue - COLONOSCOPY (Pts 45-26yrs Insurance coverage will need to be confirmed) (Every 10 Years) Overdue - never done  ? ? No completion history exists for this topic.  ? ?  ?  ? ?  ? ?Medications: ?Current Outpatient Medications on File Prior to Visit  ?Medication Sig Dispense Refill  ? Biotin 70017 MCG TABS Take 10,000 mcg by mouth daily.    ? Cyanocobalamin (B-12 PO) Take 5,000 mcg by mouth daily.    ? levocetirizine (XYZAL) 5 MG tablet Take 5 mg by mouth every evening.    ? omeprazole  (PRILOSEC) 20 MG capsule Take 20 mg by mouth daily.    ? ?No current facility-administered medications on file prior to visit.  ? ? ?Review of Systems: No headache, visual changes, nausea, vomiting, diarrhea, constipation, dizziness, abdominal pain, skin rash, fevers, chills, night sweats, swollen lymph nodes, weight loss, chest pain, body aches, joint swelling, muscle aches, shortness of breath, mood changes, visual or auditory hallucinations reported. ? ?Objective:  ? ?Vitals:  ? 12/01/21 1007  ?BP: 106/72  ?Pulse: 99  ?SpO2: 98%  ? ?Vitals:  ? 12/01/21 1007  ?Weight: 276 lb (125.2 kg)  ?Height: 6\' 1"  (1.854 m)  ? ?Body mass index is 36.41 kg/m?. ? ?General: Well Developed, well nourished, and in no acute distress.  ?Neuro: Alert and oriented x3, extra-ocular muscles intact, sensation grossly intact. Cranial nerves II through XII are grossly intact, motor, sensory, and coordinative functions are intact. ?HEENT: Normocephalic, atraumatic, pupils equal round reactive to light, neck supple, no masses, no lymphadenopathy, thyroid nonpalpable. Oropharynx, nasopharynx, external ear canals are unremarkable. ?Skin: Warm and dry, no rashes noted.  ?Cardiac: Regular rate and rhythm, no murmurs rubs or gallops. No peripheral edema. Pulses symmetric. ?Respiratory: Clear to auscultation bilaterally. Not using accessory muscles, speaking in full sentences.  ?Abdominal: Soft, nontender, nondistended, positive bowel sounds, no masses, no organomegaly. ?Musculoskeletal: Shoulder, elbow, wrist, hip,  knee, ankle stable, and with full range of motion. ? ?Impression and Recommendations:  ? ?The patient was counselled, risk factors were discussed, and anticipatory guidance given. ? ?Annual physical exam ?Annual examination completed, risk stratification labs ordered, anticipatory guidance provided.  We will follow labs once resulted.  Referral to GI for colon cancer screening placed. ? ?Chronic fatigue ?Longstanding condition in the  setting of untreated obstructive sleep apnea, allergy/snoring, and increased demands with 46-year-old at home.  He has raise concerns over testosterone levels and possible supplementation.  I have placed a referral to his current urologist for further evaluation and management.  Additionally, risk stratification labs have been ordered, will follow them accordingly.  Lastly, referral to ENT/sleep medicine placed for establishment of care, CPAP/mask coordination. ? ?Colon cancer screening ?Referral to GI placed for colon cancer screening colonoscopy. ? ?OSA (obstructive sleep apnea) ?Patient presenting with chronic fatigue in the setting of untreated OSA currently.  He is amenable to reestablishment of care with sleep medicine group, a referral was placed in that regard today. ? ?Orders & Medications ?Medications: No orders of the defined types were placed in this encounter. ? ?Orders Placed This Encounter  ?Procedures  ? DG Knee Complete 4 Views Right  ? DG Knee Complete 4 Views Left  ? HIV antibody (with reflex)  ? Hepatitis C Antibody  ? Comprehensive Metabolic Panel (CMET)  ? CBC with Differential  ? Lipid Panel With LDL/HDL Ratio  ? Apo A1 + B + Ratio  ? Vitamin D (25 hydroxy)  ? TSH  ? Ambulatory referral to Gastroenterology  ? Ambulatory referral to ENT  ? Ambulatory referral to Urology  ?  ? ?Return in about 1 year (around 12/02/2022) for Annual Physical.  ? ? ?Jerrol Banana, MD ? ? Primary Care Sports Medicine ?[facility] ?[facility]  ? ?

## 2021-12-01 NOTE — Assessment & Plan Note (Signed)
Referral to GI placed for colon cancer screening colonoscopy. ?

## 2021-12-01 NOTE — Assessment & Plan Note (Signed)
Annual examination completed, risk stratification labs ordered, anticipatory guidance provided.  We will follow labs once resulted.  Referral to GI for colon cancer screening placed. ?

## 2021-12-01 NOTE — Patient Instructions (Addendum)
-   Obtain fasting labs with orders provided (can have water or black coffee but otherwise no food or drink x 8 hours before labs) ?- Review information provided ?- Referral coordinator will contact you in regards to scheduling follow-up with ENT/sleep medicine, gastroenterology, and urology ?- Attend eye doctor annually, dentist every 6 months, work towards or maintain 30 minutes of moderate intensity physical activity at least 5 days per week, and consume a balanced diet ?- Return in 1 year for physical ?- Contact us for any questions between now and then ?

## 2021-12-03 NOTE — Assessment & Plan Note (Signed)
Patient presenting with chronic fatigue in the setting of untreated OSA currently.  He is amenable to reestablishment of care with sleep medicine group, a referral was placed in that regard today. ?

## 2021-12-06 ENCOUNTER — Telehealth: Payer: Self-pay

## 2021-12-06 NOTE — Telephone Encounter (Signed)
Returned patients call no answer left voicemail 

## 2021-12-07 ENCOUNTER — Other Ambulatory Visit: Payer: Self-pay

## 2021-12-07 DIAGNOSIS — Z1211 Encounter for screening for malignant neoplasm of colon: Secondary | ICD-10-CM

## 2021-12-07 MED ORDER — PEG 3350-KCL-NA BICARB-NACL 420 G PO SOLR: 4000.0000 mL | Freq: Once | ORAL | 0 refills | Status: AC

## 2021-12-07 NOTE — Progress Notes (Signed)
Gastroenterology Pre-Procedure Review ? ?Request Date:  ?02/01/2022 ?Requesting Physician: Dr. Vicente Males ? ?PATIENT REVIEW QUESTIONS: The patient responded to the following health history questions as indicated:   ? ?1. Are you having any GI issues? no ?2. Do you have a personal history of Polyps? no ?3. Do you have a family history of Colon Cancer or Polyps? no ?4. Diabetes Mellitus? no ?5. Joint replacements in the past 12 months?no ?6. Major health problems in the past 3 months?no ?7. Any artificial heart valves, MVP, or defibrillator?no ?   ?MEDICATIONS & ALLERGIES:    ?Patient reports the following regarding taking any anticoagulation/antiplatelet therapy:   ?Plavix, Coumadin, Eliquis, Xarelto, Lovenox, Pradaxa, Brilinta, or Effient? no ?Aspirin? no ? ?Patient confirms/reports the following medications:  ?Current Outpatient Medications  ?Medication Sig Dispense Refill  ? Biotin 10000 MCG TABS Take 10,000 mcg by mouth daily.    ? Cyanocobalamin (B-12 PO) Take 5,000 mcg by mouth daily.    ? levocetirizine (XYZAL) 5 MG tablet Take 5 mg by mouth every evening.    ? omeprazole (PRILOSEC) 20 MG capsule Take 20 mg by mouth daily.    ? ?No current facility-administered medications for this visit.  ? ? ?Patient confirms/reports the following allergies:  ?No Known Allergies ? ?No orders of the defined types were placed in this encounter. ? ? ?AUTHORIZATION INFORMATION ?Primary Insurance: ?1D#: ?Group #: ? ?Secondary Insurance: ?1D#: ?Group #: ? ?SCHEDULE INFORMATION: ?Date: 02/01/2022 ?Time: ?Location:[facility] ? ?

## 2021-12-08 DIAGNOSIS — Z1159 Encounter for screening for other viral diseases: Secondary | ICD-10-CM | POA: Diagnosis not present

## 2021-12-08 DIAGNOSIS — R7989 Other specified abnormal findings of blood chemistry: Secondary | ICD-10-CM | POA: Diagnosis not present

## 2021-12-08 DIAGNOSIS — Z13220 Encounter for screening for lipoid disorders: Secondary | ICD-10-CM | POA: Diagnosis not present

## 2021-12-08 DIAGNOSIS — Z114 Encounter for screening for human immunodeficiency virus [HIV]: Secondary | ICD-10-CM | POA: Diagnosis not present

## 2021-12-08 DIAGNOSIS — Z0000 Encounter for general adult medical examination without abnormal findings: Secondary | ICD-10-CM | POA: Diagnosis not present

## 2021-12-08 DIAGNOSIS — R7309 Other abnormal glucose: Secondary | ICD-10-CM | POA: Diagnosis not present

## 2021-12-08 DIAGNOSIS — R5382 Chronic fatigue, unspecified: Secondary | ICD-10-CM | POA: Diagnosis not present

## 2021-12-09 ENCOUNTER — Other Ambulatory Visit: Payer: Self-pay | Admitting: Family Medicine

## 2021-12-09 LAB — APO A1 + B + RATIO
Apolipo. B/A-1 Ratio: 0.8 ratio — ABNORMAL HIGH (ref 0.0–0.7)
Apolipoprotein A-1: 152 mg/dL (ref 101–178)
Apolipoprotein B: 117 mg/dL — ABNORMAL HIGH (ref <90)

## 2021-12-09 LAB — CBC WITH DIFFERENTIAL/PLATELET
Basophils Absolute: 0.1 10*3/uL (ref 0.0–0.2)
Basos: 1 %
EOS (ABSOLUTE): 0.2 10*3/uL (ref 0.0–0.4)
Eos: 2 %
Hematocrit: 46.1 % (ref 37.5–51.0)
Hemoglobin: 16.3 g/dL (ref 13.0–17.7)
Immature Grans (Abs): 0 10*3/uL (ref 0.0–0.1)
Immature Granulocytes: 0 %
Lymphocytes Absolute: 3.6 10*3/uL — ABNORMAL HIGH (ref 0.7–3.1)
Lymphs: 49 %
MCH: 30.9 pg (ref 26.6–33.0)
MCHC: 35.4 g/dL (ref 31.5–35.7)
MCV: 87 fL (ref 79–97)
Monocytes Absolute: 0.5 10*3/uL (ref 0.1–0.9)
Monocytes: 7 %
Neutrophils Absolute: 3 10*3/uL (ref 1.4–7.0)
Neutrophils: 41 %
Platelets: 274 10*3/uL (ref 150–450)
RBC: 5.28 x10E6/uL (ref 4.14–5.80)
RDW: 13.1 % (ref 11.6–15.4)
WBC: 7.3 10*3/uL (ref 3.4–10.8)

## 2021-12-09 LAB — COMPREHENSIVE METABOLIC PANEL
ALT: 35 IU/L (ref 0–44)
AST: 28 IU/L (ref 0–40)
Albumin/Globulin Ratio: 1.7 (ref 1.2–2.2)
Albumin: 4.5 g/dL (ref 4.0–5.0)
Alkaline Phosphatase: 78 IU/L (ref 44–121)
BUN/Creatinine Ratio: 15 (ref 9–20)
BUN: 15 mg/dL (ref 6–24)
Bilirubin Total: 0.3 mg/dL (ref 0.0–1.2)
CO2: 25 mmol/L (ref 20–29)
Calcium: 9.5 mg/dL (ref 8.7–10.2)
Chloride: 99 mmol/L (ref 96–106)
Creatinine, Ser: 1.02 mg/dL (ref 0.76–1.27)
Globulin, Total: 2.6 g/dL (ref 1.5–4.5)
Glucose: 112 mg/dL — ABNORMAL HIGH (ref 70–99)
Potassium: 4.2 mmol/L (ref 3.5–5.2)
Sodium: 136 mmol/L (ref 134–144)
Total Protein: 7.1 g/dL (ref 6.0–8.5)
eGFR: 91 mL/min/{1.73_m2} (ref >59)

## 2021-12-09 LAB — LIPID PANEL WITH LDL/HDL RATIO
Cholesterol, Total: 231 mg/dL — ABNORMAL HIGH (ref 100–199)
HDL: 39 mg/dL — ABNORMAL LOW (ref >39)
LDL Chol Calc (NIH): 95 mg/dL (ref 0–99)
LDL/HDL Ratio: 2.4 ratio (ref 0.0–3.6)
Triglycerides: 578 mg/dL (ref 0–149)
VLDL Cholesterol Cal: 97 mg/dL — ABNORMAL HIGH (ref 5–40)

## 2021-12-09 LAB — TSH: TSH: 5.22 u[IU]/mL — ABNORMAL HIGH (ref 0.450–4.500)

## 2021-12-09 LAB — VITAMIN D 25 HYDROXY (VIT D DEFICIENCY, FRACTURES): Vit D, 25-Hydroxy: 23.1 ng/mL — ABNORMAL LOW (ref 30.0–100.0)

## 2021-12-09 LAB — HIV ANTIBODY (ROUTINE TESTING W REFLEX): HIV Screen 4th Generation wRfx: NONREACTIVE

## 2021-12-09 LAB — HEPATITIS C ANTIBODY: Hep C Virus Ab: NONREACTIVE

## 2021-12-09 MED ORDER — VITAMIN D (ERGOCALCIFEROL) 1.25 MG (50000 UNIT) PO CAPS: 50000.0000 [IU] | ORAL_CAPSULE | ORAL | 0 refills | Status: DC

## 2021-12-09 MED ORDER — ROSUVASTATIN CALCIUM 10 MG PO TABS: 10.0000 mg | ORAL_TABLET | Freq: Every day | ORAL | 3 refills | Status: DC

## 2021-12-10 NOTE — Progress Notes (Signed)
Informed pt with labs. Pt would like to speak to you directly. ? ?KP

## 2021-12-22 ENCOUNTER — Other Ambulatory Visit: Payer: Self-pay

## 2021-12-22 ENCOUNTER — Encounter: Payer: Self-pay | Admitting: Urology

## 2021-12-22 ENCOUNTER — Ambulatory Visit: Payer: BC Managed Care – PPO | Admitting: Urology

## 2021-12-22 VITALS — BP 127/90 | HR 101 | Ht 73.0 in | Wt 270.0 lb

## 2021-12-22 DIAGNOSIS — R5383 Other fatigue: Secondary | ICD-10-CM | POA: Diagnosis not present

## 2021-12-22 NOTE — Progress Notes (Signed)
? ?  12/22/2021 ?9:49 AM  ? ?Louis Marquez ?October 31, 1970 ?[id number] ? ?Referring provider: [name], MD ?[address] ?[address] ?[place],  [place] [address] ? ?Chief Complaint  ?Patient presents with  ? Other  ? ? ?HPI: ?51 y.o. male referred for hypogonadism. ? ?Complaints of insomnia, tiredness and fatigue ?I previously saw for vasectomy 05/2021 ?PCP did not check a testosterone level ? ? ?PMH: ?Past Medical History:  ?Diagnosis Date  ? Achilles tendon tear, right, initial encounter 2009  ? Allergy   ? GERD (gastroesophageal reflux disease)   ? Varicose vein of leg   ? right leg s/p removal  ? ? ?Surgical History: ?Past Surgical History:  ?Procedure Laterality Date  ? ACHILLES TENDON REPAIR Right 2009  ? ? ?Home Medications:  ?Allergies as of 12/22/2021   ?No Known Allergies ?  ? ?  ?Medication List  ?  ? ?  ? Accurate as of December 22, 2021  9:49 AM. If you have any questions, ask your nurse or doctor.  ?  ?  ? ?  ? ?B-12 PO ?Take 5,000 mcg by mouth daily. ?  ?Biotin 10000 MCG Tabs ?Take 10,000 mcg by mouth daily. ?  ?levocetirizine 5 MG tablet ?Commonly known as: XYZAL ?Take 5 mg by mouth every evening. ?  ?omeprazole 20 MG capsule ?Commonly known as: PRILOSEC ?Take 20 mg by mouth daily. ?  ?rosuvastatin 10 MG tablet ?Commonly known as: Crestor ?Take 1 tablet (10 mg total) by mouth daily. ?  ?Vitamin D (Ergocalciferol) 1.25 MG (50000 UNIT) Caps capsule ?Commonly known as: DRISDOL ?Take 1 capsule (50,000 Units total) by mouth every 7 (seven) days. Take for 8 total doses(weeks) ?  ? ?  ? ? ?Allergies: No Known Allergies ? ?Family History: ?Family History  ?Problem Relation Age of Onset  ? Heart murmur Mother   ? Allergies Father   ? Atrial fibrillation Father   ? Varicose Veins Father   ? Liver cancer Father   ? Brain cancer Paternal Grandfather   ? ? ?Social History:  reports that he has never smoked. He has never been exposed to tobacco smoke. He has never used smokeless tobacco. He reports current alcohol  use. He reports that he does not use drugs. ? ? ?Physical Exam: ?BP 127/90   Pulse (!) 101   Ht 6\' 1"  (1.854 m)   Wt 270 lb (122.5 kg)   BMI 35.62 kg/m?   ?Constitutional:  Alert and oriented, No acute distress. ?Psychiatric: Normal mood and affect. ? ? ?Assessment & Plan:   ? ?1. Other fatigue ?Total testosterone level drawn ?If the level is normal recommend PCP follow-up for further evaluation of his symptoms ?If level is low he will schedule a repeat lab visit for testosterone and LH ? ? ?Abbie Sons, MD ? ?[facility] ?[address], [address] ?[place], [place] [address] ?[phone] ? ?

## 2021-12-23 ENCOUNTER — Encounter: Payer: Self-pay | Admitting: Urology

## 2021-12-23 ENCOUNTER — Encounter: Payer: Self-pay | Admitting: Family Medicine

## 2021-12-23 LAB — TESTOSTERONE: Testosterone: 328 ng/dL (ref 264–916)

## 2021-12-24 ENCOUNTER — Telehealth: Payer: Self-pay | Admitting: Family Medicine

## 2021-12-24 NOTE — Telephone Encounter (Signed)
-----   Message from Riki Altes, MD sent at 12/23/2021  8:05 PM EDT ----- ?Please add on a free testosterone level ?

## 2021-12-24 NOTE — Telephone Encounter (Signed)
Lab added

## 2021-12-27 LAB — HGB A1C W/O EAG: Hgb A1c MFr Bld: 5.9 % — ABNORMAL HIGH (ref 4.8–5.6)

## 2021-12-27 LAB — T3, FREE: T3, Free: 4.1 pg/mL (ref 2.0–4.4)

## 2021-12-27 LAB — T4: T4, Total: 8.7 ug/dL (ref 4.5–12.0)

## 2021-12-27 LAB — SPECIMEN STATUS REPORT

## 2022-01-07 LAB — TESTOSTERONE FREE, PROFILE I

## 2022-01-07 LAB — SPECIMEN STATUS REPORT

## 2022-01-13 DIAGNOSIS — J309 Allergic rhinitis, unspecified: Secondary | ICD-10-CM | POA: Diagnosis not present

## 2022-01-13 DIAGNOSIS — J342 Deviated nasal septum: Secondary | ICD-10-CM | POA: Diagnosis not present

## 2022-01-13 DIAGNOSIS — G4733 Obstructive sleep apnea (adult) (pediatric): Secondary | ICD-10-CM | POA: Diagnosis not present

## 2022-01-20 ENCOUNTER — Telehealth: Payer: Self-pay

## 2022-01-20 NOTE — Telephone Encounter (Signed)
CALLED PATIENT NO ANSWER LEFT VOICEMAIL FOR A CALL BACK ? ?

## 2022-01-20 NOTE — Telephone Encounter (Signed)
Patient called and needed to reschedule so called endo sent new referral and new letter out ? ?

## 2022-02-14 ENCOUNTER — Telehealth: Payer: Self-pay

## 2022-02-14 NOTE — Telephone Encounter (Signed)
Patient called and rescheduled procedure for 03/09/2022 called endo sent new letter sand new refferal

## 2022-03-03 ENCOUNTER — Telehealth: Payer: Self-pay

## 2022-03-03 NOTE — Telephone Encounter (Signed)
Rescheduled patient sent new letters and new referral and called endo ?

## 2022-03-08 DIAGNOSIS — J301 Allergic rhinitis due to pollen: Secondary | ICD-10-CM | POA: Diagnosis not present

## 2022-03-11 NOTE — Telephone Encounter (Signed)
Patient left vm stating he needs to reschedule is procedure. Requesting a call back.

## 2022-03-15 ENCOUNTER — Ambulatory Visit
Admission: RE | Admit: 2022-03-15 | Payer: BC Managed Care – PPO | Source: Home / Self Care | Admitting: Gastroenterology

## 2022-03-15 ENCOUNTER — Encounter: Admission: RE | Payer: Self-pay | Source: Home / Self Care

## 2022-03-15 SURGERY — COLONOSCOPY WITH PROPOFOL
Anesthesia: General

## 2022-03-20 ENCOUNTER — Encounter: Payer: Self-pay | Admitting: Family Medicine

## 2022-03-21 ENCOUNTER — Telehealth: Payer: Self-pay | Admitting: Family Medicine

## 2022-03-21 DIAGNOSIS — E782 Mixed hyperlipidemia: Secondary | ICD-10-CM

## 2022-03-23 ENCOUNTER — Telehealth: Payer: Self-pay | Admitting: Family Medicine

## 2022-03-23 NOTE — Telephone Encounter (Signed)
Copied from CRM [id number]. Topic: General - Other >> Mar 23, 2022 12:48 PM Everette C wrote: Reason for XIH:WTUUE with [facility] has called to follow up on the previous request for a missing diagnosis code for the patient's rosuvastatin (CRESTOR) 10 MG tablet [[id number]]  prescription   Barbara Cower would like to know if the practice was able to successfully reach the patient's preferred pharmacy to provide the additional information   Case # [id number]  Please contact [facility] further when possible

## 2022-03-23 NOTE — Telephone Encounter (Signed)
Pt prescription was sent to [facility] 11/2021. Medication was not sent to [facility].  KP

## 2022-03-23 NOTE — Telephone Encounter (Signed)
Patient called, left VM to return the call to the office. Will need to know if he received his rosuvastatin from [facility], sent on 12/09/21 #90/3 refills.

## 2022-03-23 NOTE — Telephone Encounter (Signed)
Called pt left VM to call back. Called to see it pt has been able to get his cholesterol medication from [facility].  KP

## 2022-03-24 ENCOUNTER — Other Ambulatory Visit: Payer: Self-pay

## 2022-03-24 DIAGNOSIS — E782 Mixed hyperlipidemia: Secondary | ICD-10-CM

## 2022-03-24 DIAGNOSIS — Z13220 Encounter for screening for lipoid disorders: Secondary | ICD-10-CM

## 2022-03-24 NOTE — Telephone Encounter (Signed)
Patient called in stating he missed phone calls yesterday. He states he did in fact received his rosuvastatin from [facility] but unlike last time which was free because insurance covered it with the diagnosis code that was sent in he had to pay $120 this time due to it not being coded properly for some reason. Please assist patient further.

## 2022-03-25 NOTE — Telephone Encounter (Signed)
Spoke to pt told him that we sent in a years worth of refills 90 w/ 3 RF. That the second time he went to refill his medication the same code should have been attached told pt I will print the prescription with the date time and code on it for him. Pt verbalized understanding.  KP

## 2022-04-12 DIAGNOSIS — G4733 Obstructive sleep apnea (adult) (pediatric): Secondary | ICD-10-CM | POA: Diagnosis not present

## 2022-04-13 DIAGNOSIS — G4733 Obstructive sleep apnea (adult) (pediatric): Secondary | ICD-10-CM | POA: Diagnosis not present

## 2022-05-03 ENCOUNTER — Telehealth: Payer: Self-pay

## 2022-05-03 NOTE — Telephone Encounter (Signed)
Patient left a voicemail on the triage nurse line for colonoscopy that he needed to reschedule his colonoscopy to week of 05/30/2022. He said to call him back and informed him on mychart when it is moved to. Reschedule to 05/31/2022. Called trish and informed trish. Sent new instructions to mychart. Called patient and left a detail message

## 2022-05-13 DIAGNOSIS — G4733 Obstructive sleep apnea (adult) (pediatric): Secondary | ICD-10-CM | POA: Diagnosis not present

## 2022-05-14 DIAGNOSIS — G4733 Obstructive sleep apnea (adult) (pediatric): Secondary | ICD-10-CM | POA: Diagnosis not present

## 2022-05-23 DIAGNOSIS — G4733 Obstructive sleep apnea (adult) (pediatric): Secondary | ICD-10-CM | POA: Diagnosis not present

## 2022-05-25 ENCOUNTER — Telehealth: Payer: Self-pay | Admitting: Family Medicine

## 2022-05-25 NOTE — Telephone Encounter (Signed)
Patient called back and he said his kids school is starting on 05/31/2022 and he wants to take them for the first day. He wants to move to 07/20/2022. He states he does not need new instructions sent out because he has several copies of them. Called endo and talk to penny she states she will get patient moved

## 2022-05-25 NOTE — Telephone Encounter (Signed)
PC to pt, gave pt DX code, he will look into the pharmacy change. Will go to [facility] near him to get blood drawn

## 2022-05-25 NOTE — Telephone Encounter (Signed)
Pt called in stated he needs to know if his Rx for the medication rosuvastatin (CRESTOR) was changed from the first time he picked it up in March to when he got the last refill in June. Pt mentioned being charged a $145.00 co-pay his refill is coming up, and he is trying to take care of this issue now.   Pt stated [facility] continues to advise him that the Rx was changed, and he doesn't understand why if this was just a refill.   Pt stated he left insurance vm yesterday working with the third manager, and they are again saying PCP changed Rx and to contact PCP.   Pt is requesting a call back to discuss .

## 2022-05-25 NOTE — Telephone Encounter (Signed)
Patient left a another voicemail about rescheduling his colonoscopy. Called patient and left a message for call back

## 2022-05-25 NOTE — Telephone Encounter (Signed)
Copied from CRM [id number]. Topic: General - Inquiry >> May 25, 2022 10:36 AM De Blanch wrote: Reason for CRM: Pt is asking if he can go to the [facility] near his home in [place] to take care of pending labs. Instead of having to come into the office.   Please advise.

## 2022-06-13 DIAGNOSIS — G4733 Obstructive sleep apnea (adult) (pediatric): Secondary | ICD-10-CM | POA: Diagnosis not present

## 2022-06-14 DIAGNOSIS — E782 Mixed hyperlipidemia: Secondary | ICD-10-CM | POA: Diagnosis not present

## 2022-06-14 DIAGNOSIS — G4733 Obstructive sleep apnea (adult) (pediatric): Secondary | ICD-10-CM | POA: Diagnosis not present

## 2022-06-14 DIAGNOSIS — Z13220 Encounter for screening for lipoid disorders: Secondary | ICD-10-CM | POA: Diagnosis not present

## 2022-06-15 LAB — COMPREHENSIVE METABOLIC PANEL
ALT: 28 IU/L (ref 0–44)
AST: 25 IU/L (ref 0–40)
Albumin/Globulin Ratio: 1.6 (ref 1.2–2.2)
Albumin: 4.3 g/dL (ref 4.1–5.1)
Alkaline Phosphatase: 86 IU/L (ref 44–121)
BUN/Creatinine Ratio: 13 (ref 9–20)
BUN: 12 mg/dL (ref 6–24)
Bilirubin Total: 0.4 mg/dL (ref 0.0–1.2)
CO2: 23 mmol/L (ref 20–29)
Calcium: 9.1 mg/dL (ref 8.7–10.2)
Chloride: 99 mmol/L (ref 96–106)
Creatinine, Ser: 0.94 mg/dL (ref 0.76–1.27)
Globulin, Total: 2.7 g/dL (ref 1.5–4.5)
Glucose: 108 mg/dL — ABNORMAL HIGH (ref 70–99)
Potassium: 4 mmol/L (ref 3.5–5.2)
Sodium: 136 mmol/L (ref 134–144)
Total Protein: 7 g/dL (ref 6.0–8.5)
eGFR: 99 mL/min/{1.73_m2} (ref >59)

## 2022-06-15 LAB — LIPID PANEL
Chol/HDL Ratio: 4.2 ratio (ref 0.0–5.0)
Cholesterol, Total: 162 mg/dL (ref 100–199)
HDL: 39 mg/dL — ABNORMAL LOW (ref >39)
LDL Chol Calc (NIH): 33 mg/dL (ref 0–99)
Triglycerides: 667 mg/dL (ref 0–149)
VLDL Cholesterol Cal: 90 mg/dL — ABNORMAL HIGH (ref 5–40)

## 2022-06-15 LAB — APO A1 + B + RATIO
Apolipo. B/A-1 Ratio: 0.4 ratio (ref 0.0–0.7)
Apolipoprotein A-1: 172 mg/dL (ref 101–178)
Apolipoprotein B: 63 mg/dL (ref <90)

## 2022-06-16 ENCOUNTER — Other Ambulatory Visit: Payer: Self-pay | Admitting: Family Medicine

## 2022-06-16 DIAGNOSIS — E782 Mixed hyperlipidemia: Secondary | ICD-10-CM

## 2022-06-16 MED ORDER — FENOFIBRATE MICRONIZED 43 MG PO CAPS: 43.0000 mg | ORAL_CAPSULE | Freq: Every day | ORAL | 0 refills | Status: DC

## 2022-06-16 MED ORDER — ICOSAPENT ETHYL 1 G PO CAPS: 2.0000 g | ORAL_CAPSULE | Freq: Two times a day (BID) | ORAL | 0 refills | Status: DC

## 2022-06-16 NOTE — Progress Notes (Signed)
Could not add A1C due to lavender tube not being used for previous labs.  KP

## 2022-06-22 ENCOUNTER — Encounter: Payer: Self-pay | Admitting: Family Medicine

## 2022-06-22 ENCOUNTER — Telehealth (INDEPENDENT_AMBULATORY_CARE_PROVIDER_SITE_OTHER): Payer: BC Managed Care – PPO | Admitting: Family Medicine

## 2022-06-22 DIAGNOSIS — G4733 Obstructive sleep apnea (adult) (pediatric): Secondary | ICD-10-CM

## 2022-06-22 DIAGNOSIS — Z1211 Encounter for screening for malignant neoplasm of colon: Secondary | ICD-10-CM

## 2022-06-22 DIAGNOSIS — E782 Mixed hyperlipidemia: Secondary | ICD-10-CM | POA: Diagnosis not present

## 2022-06-22 DIAGNOSIS — R0789 Other chest pain: Secondary | ICD-10-CM | POA: Diagnosis not present

## 2022-06-22 DIAGNOSIS — E785 Hyperlipidemia, unspecified: Secondary | ICD-10-CM | POA: Insufficient documentation

## 2022-06-22 DIAGNOSIS — E781 Pure hyperglyceridemia: Secondary | ICD-10-CM

## 2022-06-22 DIAGNOSIS — E079 Disorder of thyroid, unspecified: Secondary | ICD-10-CM

## 2022-06-22 DIAGNOSIS — R7301 Impaired fasting glucose: Secondary | ICD-10-CM

## 2022-06-22 NOTE — Progress Notes (Signed)
Primary Care / Sports Medicine Virtual Visit  Patient Information:  Patient ID: Louis Marquez, male DOB: August 12, 1971 Age: 51 y.o. MRN: [id number]   Louis Marquez is a pleasant 51 y.o. male presenting with the following:  Chief Complaint  Patient presents with   Mixed hyperlipidemia    Review of Systems: No fevers, chills, night sweats, weight loss, chest pain, or shortness of breath.   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   Cyanocobalamin (B-12 PO) Take 5,000 mcg by mouth daily.   fenofibrate micronized (ANTARA) 43 MG capsule Take 1 capsule (43 mg total) by mouth daily before breakfast.   levocetirizine (XYZAL) 5 MG tablet Take 5 mg by mouth every evening.   omeprazole (PRILOSEC) 20 MG capsule Take 20 mg by mouth daily.   rosuvastatin (CRESTOR) 10 MG tablet Take 1 tablet (10 mg total) by mouth daily.   icosapent Ethyl (VASCEPA) 1 g capsule Take 2 capsules (2 g total) by mouth 2 (two) times daily. (Patient not taking: Reported on 06/22/2022)   [DISCONTINUED] Vitamin D, Ergocalciferol, (DRISDOL) 1.25 MG (50000 UNIT) CAPS capsule Take 1 capsule (50,000 Units total) by mouth every 7 (seven) days. Take for 8 total doses(weeks)   No facility-administered encounter  medications on file as of 06/22/2022.   Past Surgical History:  Procedure Laterality Date   ACHILLES TENDON REPAIR Right 2009    Virtual Visit via MyChart Video:   I connected with Prescott Parma on 06/22/22 via MyChart Video and verified that I am speaking with the correct person using appropriate identifiers.   The limitations, risks, security and privacy concerns of performing an evaluation and management service by MyChart Video, including the higher likelihood of inaccurate diagnoses and treatments, and the availability of in person appointments were reviewed. The possible need of an additional face-to-face encounter for complete and high quality delivery of care was discussed. The patient was also made aware that there may be a patient responsible charge related to this service. The patient expressed understanding and wishes to proceed.  Provider location is in medical facility. Patient location is at their home, different from provider location. People involved in care of the patient during this telehealth encounter were myself, my nurse/medical assistant, and my front office/scheduling team member.  Objective findings:   General: Speaking full sentences, no audible heavy breathing. Sounds alert and appropriately interactive. Well-appearing. Face symmetric. Extraocular movements intact. Pupils equal and round. No nasal flaring or accessory muscle use visualized.  Independent interpretation of notes and tests performed by another provider:   None  Pertinent History, Exam, Impression, and Recommendations:   OSA (obstructive sleep apnea) Patient has started use of CPAP and tolerating the same, we will follow-up on this  issue at his in person in 3 months at return.  Hyperlipidemia Chronic condition, most recent panels demonstrate improvements across-the-board except for isolated hypertriglyceridemia, new medications icosapent ethyl (Vascepa) and fenofibrate were started once labs  were obtained.  We will plan to recheck these labs prior to his return in 3 months, in addition to stressing lifestyle modifications, a referral to cardiologist has been placed for further optimization and to evaluate nonexertional chest pain.  Nonexertional chest pain New issue, ongoing for the past few months, patient reports substernal central nonradiating chest discomfort, not aggravated by walking, physical exertion, does have comorbid conditions including GERD.  Given his additional health history inclusive of recalcitrant hyper electricity hypertriglyceridemia, prediabetes, elevated BMI, I have advised further evaluation by cardiology.  He has previously seen Dr. Alveria Apley group, a referral to the same has been placed today.  We will coordinate a follow-up in 3 months to review their work-up and reassess symptoms.  Orders & Medications No orders of the defined types were placed in this encounter.  Orders Placed This Encounter  Procedures   Comprehensive metabolic panel   CBC   Lipid panel   TSH   Apo A1 + B + Ratio   T3, free   T4, free   HgB A1c   Ambulatory referral to Cardiology     I discussed the above assessment and treatment plan with the patient. The patient was provided an opportunity to ask questions and all were answered. The patient agreed with the plan and demonstrated an understanding of the instructions.   The patient was advised to call back or seek an in-person evaluation if the symptoms worsen or if the condition fails to improve as anticipated.   I provided a total time of 30 minutes including both face-to-face and non-face-to-face time on 06/22/2022 inclusive of time utilized for medical chart review, information gathering, care coordination with staff, and documentation completion.    [name], MD   Primary Care Sports Medicine [place]

## 2022-06-22 NOTE — Assessment & Plan Note (Signed)
Patient has started use of CPAP and tolerating the same, we will follow-up on this issue at his in person in 3 months at return.

## 2022-06-22 NOTE — Assessment & Plan Note (Signed)
New issue, ongoing for the past few months, patient reports substernal central nonradiating chest discomfort, not aggravated by walking, physical exertion, does have comorbid conditions including GERD.  Given his additional health history inclusive of recalcitrant hyper electricity hypertriglyceridemia, prediabetes, elevated BMI, I have advised further evaluation by cardiology.  He has previously seen Dr. Alveria Apley group, a referral to the same has been placed today.  We will coordinate a follow-up in 3 months to review their work-up and reassess symptoms.

## 2022-06-22 NOTE — Assessment & Plan Note (Signed)
Chronic condition, most recent panels demonstrate improvements across-the-board except for isolated hypertriglyceridemia, new medications icosapent ethyl (Vascepa) and fenofibrate were started once labs were obtained.  We will plan to recheck these labs prior to his return in 3 months, in addition to stressing lifestyle modifications, a referral to cardiologist has been placed for further optimization and to evaluate nonexertional chest pain.

## 2022-06-22 NOTE — Patient Instructions (Addendum)
-   Continue current medications - Review information regarding high triglyceride eating plan - Continue your current healthy lifestyle changes - Referral coordinator will contact you to schedule visit with cardiologist - Return for follow-up in 3 months, obtain new fasting lab orders a few days prior to that visit (stop by our office to pick up forms) - Contact us for any questions between now and then

## 2022-06-23 DIAGNOSIS — G4733 Obstructive sleep apnea (adult) (pediatric): Secondary | ICD-10-CM | POA: Diagnosis not present

## 2022-06-28 ENCOUNTER — Encounter: Payer: Self-pay | Admitting: Family Medicine

## 2022-07-13 DIAGNOSIS — G4733 Obstructive sleep apnea (adult) (pediatric): Secondary | ICD-10-CM | POA: Diagnosis not present

## 2022-07-19 ENCOUNTER — Encounter: Payer: Self-pay | Admitting: Gastroenterology

## 2022-07-20 ENCOUNTER — Ambulatory Visit: Payer: BC Managed Care – PPO | Admitting: Certified Registered Nurse Anesthetist

## 2022-07-20 ENCOUNTER — Encounter
Admission: RE | Disposition: A | Discharge status: Home / Self Care | Payer: Self-pay | Source: Ambulatory Visit | Attending: Gastroenterology

## 2022-07-20 ENCOUNTER — Encounter: Payer: Self-pay | Admitting: Gastroenterology

## 2022-07-20 ENCOUNTER — Ambulatory Visit
Admission: RE | Admit: 2022-07-20 | Discharge: 2022-07-20 | Disposition: A | Discharge status: Home / Self Care | Payer: BC Managed Care – PPO | Source: Ambulatory Visit | Attending: Gastroenterology | Admitting: Gastroenterology

## 2022-07-20 DIAGNOSIS — K219 Gastro-esophageal reflux disease without esophagitis: Secondary | ICD-10-CM | POA: Diagnosis not present

## 2022-07-20 DIAGNOSIS — Z1211 Encounter for screening for malignant neoplasm of colon: Secondary | ICD-10-CM | POA: Insufficient documentation

## 2022-07-20 DIAGNOSIS — G4730 Sleep apnea, unspecified: Secondary | ICD-10-CM | POA: Diagnosis not present

## 2022-07-20 DIAGNOSIS — E785 Hyperlipidemia, unspecified: Secondary | ICD-10-CM | POA: Diagnosis not present

## 2022-07-20 HISTORY — PX: COLONOSCOPY WITH PROPOFOL: SHX5780

## 2022-07-20 SURGERY — COLONOSCOPY WITH PROPOFOL
Anesthesia: General

## 2022-07-20 MED ORDER — PHENYLEPHRINE 80 MCG/ML (10ML) SYRINGE FOR IV PUSH (FOR BLOOD PRESSURE SUPPORT)
PREFILLED_SYRINGE | INTRAVENOUS | Status: DC | PRN
  Administered 2022-07-20: 80 ug via INTRAVENOUS

## 2022-07-20 MED ORDER — PROPOFOL 10 MG/ML IV BOLUS
INTRAVENOUS | Status: DC | PRN
  Administered 2022-07-20 (×2): 50 mg via INTRAVENOUS

## 2022-07-20 MED ORDER — LIDOCAINE HCL (CARDIAC) PF 100 MG/5ML IV SOSY
PREFILLED_SYRINGE | INTRAVENOUS | Status: DC | PRN
  Administered 2022-07-20: 50 mg via INTRAVENOUS

## 2022-07-20 MED ORDER — SODIUM CHLORIDE 0.9 % IV SOLN: INTRAVENOUS | Status: DC

## 2022-07-20 MED ORDER — PROPOFOL 10 MG/ML IV BOLUS
INTRAVENOUS | Status: AC
  Filled 2022-07-20: qty 20

## 2022-07-20 MED ORDER — PROPOFOL 500 MG/50ML IV EMUL
INTRAVENOUS | Status: DC | PRN
  Administered 2022-07-20: 125 ug/kg/min via INTRAVENOUS

## 2022-07-20 NOTE — H&P (Signed)
Louis Darby, MD [address]  [place]  [place], [place] [address]  Main: [phone]  Fax: [phone] Pager: [phone]  Primary Care Physician:  [name], MD Primary Gastroenterologist:  Dr. Cephas Marquez  Pre-Procedure History & Physical: HPI:  Louis Marquez is a 51 y.o. male is here for an colonoscopy.   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 Authorizing Provider  Biotin 10000 MCG TABS Take 10,000 mcg by mouth daily.   Yes [provider]  Cyanocobalamin (B-12 PO) Take 5,000 mcg by mouth daily.   Yes [provider]  fenofibrate micronized (ANTARA) 43 MG capsule Take 1 capsule (43 mg total) by mouth daily before breakfast. 06/16/22  Yes [name], MD  levocetirizine (XYZAL) 5 MG tablet Take 5 mg by mouth every evening.   Yes [provider]  icosapent Ethyl (VASCEPA) 1 g capsule Take 2 capsules (2 g total) by mouth 2 (two) times daily. Patient not taking: Reported on 06/22/2022 06/16/22   [name], MD  omeprazole (PRILOSEC) 20 MG capsule Take 20 mg by mouth daily.    [provider]  rosuvastatin (CRESTOR) 10 MG tablet Take 1 tablet (10 mg total) by mouth daily. 12/09/21   [name], MD    Allergies as of 03/11/2022   (No Known Allergies)    Family History  Problem Relation Age of Onset   Heart murmur Mother    Allergies Father    Atrial fibrillation Father    Varicose Veins Father    Liver cancer Father    Hyperthyroidism Father    Brain cancer Paternal Grandfather     Social History   Socioeconomic History   Marital status: Married     Spouse name: Louis Marquez   Number of children: 3   Years of education: 16   Highest education level: Bachelor's degree (e.g., BA, AB, BS)  Occupational History   Not on file  Tobacco Use   Smoking status: Never    Passive exposure: Never   Smokeless tobacco: Never  Vaping Use   Vaping Use: Never used  Substance and Sexual Activity   Alcohol use: Yes   Drug use: Never   Sexual activity: Yes    Partners: Female  Other Topics Concern   Not on file  Social History Narrative   Not on file   Social Determinants of Health   Financial Resource Strain: Not on file  Food Insecurity: Not on file  Transportation Needs: Not on file  Physical Activity: Not on file  Stress: Not on file  Social Connections: Not on file  Intimate Partner Violence: Not on file    Review of Systems: See HPI, otherwise negative ROS  Physical Exam: BP (!) 128/92   Pulse 86   Temp (!) 96.1 F (35.6 C) (Temporal)   Resp 20   Ht 6\' 1"  (1.854 m)   Wt 117.9 kg   SpO2 99%   BMI 34.30 kg/m  General:   Alert,  pleasant and cooperative in NAD Head:  Normocephalic and atraumatic. Neck:  Supple;  no masses or thyromegaly. Lungs:  Clear throughout to auscultation.    Heart:  Regular rate and rhythm. Abdomen:  Soft, nontender and nondistended. Normal bowel sounds, without guarding, and without rebound.   Neurologic:  Alert and  oriented x4;  grossly normal neurologically.  Impression/Plan: Louis Marquez is here for an colonoscopy to be performed for colon cancer screening  Risks, benefits, limitations, and alternatives regarding  colonoscopy have been reviewed with the patient.  Questions have been answered.  All parties agreeable.   Sherri Sear, MD  07/20/2022, 9:34 AM

## 2022-07-20 NOTE — Transfer of Care (Signed)
Immediate Anesthesia Transfer of Care Note  Patient: Louis Marquez  Procedure(s) Performed: COLONOSCOPY WITH PROPOFOL  Patient Location: PACU  Anesthesia Type:General  Level of Consciousness: awake, alert  and oriented  Airway & Oxygen Therapy: Patient Spontanous Breathing and Patient connected to nasal cannula oxygen  Post-op Assessment: Report given to RN and Post -op Vital signs reviewed and stable  Post vital signs: Reviewed and stable  Last Vitals:  Vitals Value Taken Time  BP 98/57 07/20/22 1026  Temp 36.1 C 07/20/22 1026  Pulse 80 07/20/22 1026  Resp    SpO2 98 % 07/20/22 1026    Last Pain:  Vitals:   07/20/22 1026  TempSrc: Temporal  PainSc: 0-No pain         Complications: No notable events documented.

## 2022-07-20 NOTE — Op Note (Signed)
[facility] Patient Name: Louis Marquez Procedure Date: 07/20/2022 10:06 AM MRN: [id number] Account #: [id number] Date of Birth: 1971-02-02 Admit Type: Outpatient Age: 51 Room: [facility] ENDO ROOM 4 Gender: Male Note Status: Finalized Instrument Name: Jasper Riling [id number] Procedure:             Colonoscopy Indications:           Screening for colorectal malignant neoplasm, This is                         the patient's first colonoscopy Providers:             Lin Landsman MD, MD Referring MD:          No Local Md, MD (Referring MD) Medicines:             General Anesthesia Complications:         No immediate complications. Estimated blood loss: None. Procedure:             Pre-Anesthesia Assessment:                        - Prior to the procedure, a History and Physical was                         performed, and patient medications and allergies were                         reviewed. The patient is competent. The risks and                         benefits of the procedure and the sedation options and                         risks were discussed with the patient. All questions                         were answered and informed consent was obtained.                         Patient identification and proposed procedure were                         verified by the physician, the nurse, the                         anesthesiologist, the anesthetist and the technician                         in the pre-procedure area in the procedure room in the                         endoscopy suite. Mental Status Examination: alert and                         oriented. Airway Examination: normal oropharyngeal                         airway and neck mobility. Respiratory Examination:  clear to auscultation. CV Examination: normal.                         Prophylactic Antibiotics: The patient does not require                         prophylactic  antibiotics. Prior Anticoagulants: The                         patient has taken no anticoagulant or antiplatelet                         agents. ASA Grade Assessment: II - A patient with mild                         systemic disease. After reviewing the risks and                         benefits, the patient was deemed in satisfactory                         condition to undergo the procedure. The anesthesia                         plan was to use general anesthesia. Immediately prior                         to administration of medications, the patient was                         re-assessed for adequacy to receive sedatives. The                         heart rate, respiratory rate, oxygen saturations,                         blood pressure, adequacy of pulmonary ventilation, and                         response to care were monitored throughout the                         procedure. The physical status of the patient was                         re-assessed after the procedure.                        After obtaining informed consent, the colonoscope was                         passed under direct vision. Throughout the procedure,                         the patient's blood pressure, pulse, and oxygen                         saturations were monitored continuously. The  Colonoscope was introduced through the anus and                         advanced to the the cecum, identified by appendiceal                         orifice and ileocecal valve. The colonoscopy was                         performed without difficulty. The patient tolerated                         the procedure well. The quality of the bowel                         preparation was evaluated using the BBPS [facility] Bowel                         Preparation Scale) with scores of: Right Colon = 1                         (portion of mucosa seen, but other areas not well seen                         due to  staining, residual stool and/or opaque liquid),                         Transverse Colon = 3 (entire mucosa seen well with no                         residual staining, small fragments of stool or opaque                         liquid) and Left Colon = 3 (entire mucosa seen well                         with no residual staining, small fragments of stool or                         opaque liquid). The total BBPS score equals 7. The                         terminal ileum, ileocecal valve, appendiceal orifice,                         and rectum were photographed. Findings:      The perianal and digital rectal examinations were normal. Pertinent       negatives include normal sphincter tone and no palpable rectal lesions.      Copious quantities of semi-liquid stool was found in the ascending colon       and in the cecum, precluding visualization.      The rectum, recto-sigmoid colon, sigmoid colon and transverse colon       appeared normal.      The retroflexed view of the distal rectum and anal verge was normal and       showed no anal or rectal abnormalities.  The terminal ileum appeared normal. Impression:            - Stool in the ascending colon and in the cecum.                        - The rectum, sigmoid colon, transverse colon and                         recto-sigmoid colon are normal.                        - The distal rectum and anal verge are normal on                         retroflexion view.                        - The examined portion of the ileum was normal.                        - No specimens collected. Recommendation:        - Discharge patient to home (with escort).                        - Resume previous diet today.                        - Continue present medications.                        - Repeat colonoscopy wih 2 day prep at the next                         available appointment because the bowel preparation                         was  suboptimal. Procedure Code(s):     --- Professional ---                        X7353, Colorectal cancer screening; colonoscopy on                         individual not meeting criteria for high risk Diagnosis Code(s):     --- Professional ---                        Z12.11, Encounter for screening for malignant neoplasm                         of colon CPT copyright 2022 American Medical Association. All rights reserved. The codes documented in this report are preliminary and upon coder review may  be revised to meet current compliance requirements. Dr. Libby Maw Toney Reil MD, MD 07/20/2022 10:27:47 AM This report has been signed electronically. Number of Addenda: 0 Note Initiated On: 07/20/2022 10:06 AM Scope Withdrawal Time: 0 hours 6 minutes 53 seconds  Total Procedure Duration: 0 hours 8 minutes 13 seconds  Estimated Blood Loss:  Estimated blood loss: none.      [facility]

## 2022-07-20 NOTE — Anesthesia Postprocedure Evaluation (Signed)
Anesthesia Post Note  Patient: Louis Marquez  Procedure(s) Performed: COLONOSCOPY WITH PROPOFOL  Patient location during evaluation: Endoscopy Anesthesia Type: General Level of consciousness: awake and alert Pain management: pain level controlled Vital Signs Assessment: post-procedure vital signs reviewed and stable Respiratory status: spontaneous breathing, nonlabored ventilation, respiratory function stable and patient connected to nasal cannula oxygen Cardiovascular status: blood pressure returned to baseline and stable Postop Assessment: no apparent nausea or vomiting Anesthetic complications: no   No notable events documented.   Last Vitals:  Vitals:   07/20/22 1036 07/20/22 1046  BP: 108/74 115/76  Pulse: 85 76  Resp: 18 13  Temp:    SpO2:  100%    Last Pain:  Vitals:   07/20/22 1046  TempSrc:   PainSc: 0-No pain                 Dimas Millin

## 2022-07-20 NOTE — Anesthesia Preprocedure Evaluation (Signed)
Anesthesia Evaluation  Patient identified by MRN, date of birth, ID band Patient awake    Reviewed: Allergy & Precautions, NPO status , Patient's Chart, lab work & pertinent test results  History of Anesthesia Complications Negative for: history of anesthetic complications  Airway Mallampati: III  TM Distance: >3 FB Neck ROM: full    Dental  (+) Dental Advidsory Given, Teeth Intact   Pulmonary neg shortness of breath, sleep apnea , neg COPD,    Pulmonary exam normal        Cardiovascular (-) angina(-) Past MI and (-) CABG negative cardio ROS Normal cardiovascular exam     Neuro/Psych negative neurological ROS  negative psych ROS   GI/Hepatic Neg liver ROS, GERD  ,  Endo/Other  negative endocrine ROS  Renal/GU negative Renal ROS  negative genitourinary   Musculoskeletal   Abdominal   Peds  Hematology negative hematology ROS (+)   Anesthesia Other Findings Past Medical History: 2009: Achilles tendon tear, right, initial encounter No date: Allergy No date: GERD (gastroesophageal reflux disease) No date: Hyperlipidemia No date: Varicose vein of leg     Comment:  right leg s/p removal  Past Surgical History: 2009: ACHILLES TENDON REPAIR; Right  BMI    Body Mass Index: 34.30 kg/m      Reproductive/Obstetrics negative OB ROS                             Anesthesia Physical Anesthesia Plan  ASA: 2  Anesthesia Plan: General   Post-op Pain Management: Minimal or no pain anticipated   Induction: Intravenous  PONV Risk Score and Plan: 3 and Propofol infusion, TIVA and Ondansetron  Airway Management Planned: Nasal Cannula  Additional Equipment: None  Intra-op Plan:   Post-operative Plan:   Informed Consent: I have reviewed the patients History and Physical, chart, labs and discussed the procedure including the risks, benefits and alternatives for the proposed anesthesia with  the patient or authorized representative who has indicated his/her understanding and acceptance.     Dental advisory given  Plan Discussed with: CRNA and Surgeon  Anesthesia Plan Comments: (Discussed risks of anesthesia with patient, including possibility of difficulty with spontaneous ventilation under anesthesia necessitating airway intervention, PONV, and rare risks such as cardiac or respiratory or neurological events, and allergic reactions. Discussed the role of CRNA in patient's perioperative care. Patient understands.)        Anesthesia Quick Evaluation

## 2022-07-21 ENCOUNTER — Encounter: Payer: Self-pay | Admitting: Gastroenterology

## 2022-07-23 DIAGNOSIS — G4733 Obstructive sleep apnea (adult) (pediatric): Secondary | ICD-10-CM | POA: Diagnosis not present

## 2022-07-28 ENCOUNTER — Telehealth: Payer: Self-pay

## 2022-07-28 NOTE — Telephone Encounter (Signed)
Patient returned call and called patient back and left a message for call back

## 2022-07-28 NOTE — Telephone Encounter (Signed)
Patient had colonoscopy on 07/20/2022 and patient was not clean out. He was told to repeat colonoscopy with 2 day prep at next available time. Called and left a message for call back to get procedure schedule

## 2022-07-29 NOTE — Telephone Encounter (Signed)
Patient states he can not schedule the colonoscopy at this time because he is leaving for a work trip for a couple of weeks. He states he will give [place] a call when he is ready to schedule. Put in recall for 6 months so it will not be missed for his colonoscopy

## 2022-07-30 ENCOUNTER — Encounter: Payer: Self-pay | Admitting: Family Medicine

## 2022-08-16 DIAGNOSIS — I10 Essential (primary) hypertension: Secondary | ICD-10-CM | POA: Diagnosis not present

## 2022-08-16 DIAGNOSIS — E669 Obesity, unspecified: Secondary | ICD-10-CM | POA: Diagnosis not present

## 2022-08-16 DIAGNOSIS — R0602 Shortness of breath: Secondary | ICD-10-CM | POA: Diagnosis not present

## 2022-08-16 DIAGNOSIS — I2089 Other forms of angina pectoris: Secondary | ICD-10-CM | POA: Diagnosis not present

## 2022-08-17 ENCOUNTER — Other Ambulatory Visit: Payer: Self-pay | Admitting: Internal Medicine

## 2022-08-17 DIAGNOSIS — I2089 Other forms of angina pectoris: Secondary | ICD-10-CM

## 2022-09-12 ENCOUNTER — Other Ambulatory Visit: Payer: Self-pay

## 2022-09-12 ENCOUNTER — Ambulatory Visit
Admission: RE | Admit: 2022-09-12 | Discharge: 2022-09-12 | Disposition: A | Discharge status: Home / Self Care | Payer: BC Managed Care – PPO | Source: Ambulatory Visit | Attending: Internal Medicine | Admitting: Internal Medicine

## 2022-09-12 DIAGNOSIS — R7301 Impaired fasting glucose: Secondary | ICD-10-CM | POA: Diagnosis not present

## 2022-09-12 DIAGNOSIS — E782 Mixed hyperlipidemia: Secondary | ICD-10-CM | POA: Diagnosis not present

## 2022-09-12 DIAGNOSIS — I2089 Other forms of angina pectoris: Secondary | ICD-10-CM | POA: Diagnosis not present

## 2022-09-12 DIAGNOSIS — R0789 Other chest pain: Secondary | ICD-10-CM | POA: Diagnosis not present

## 2022-09-12 DIAGNOSIS — E079 Disorder of thyroid, unspecified: Secondary | ICD-10-CM

## 2022-09-12 DIAGNOSIS — R0602 Shortness of breath: Secondary | ICD-10-CM | POA: Diagnosis not present

## 2022-09-12 DIAGNOSIS — E781 Pure hyperglyceridemia: Secondary | ICD-10-CM | POA: Diagnosis not present

## 2022-09-12 DIAGNOSIS — G4733 Obstructive sleep apnea (adult) (pediatric): Secondary | ICD-10-CM

## 2022-09-13 LAB — CBC
Hematocrit: 44.8 % (ref 37.5–51.0)
Hemoglobin: 14.9 g/dL (ref 13.0–17.7)
MCH: 29.9 pg (ref 26.6–33.0)
MCHC: 33.3 g/dL (ref 31.5–35.7)
MCV: 90 fL (ref 79–97)
Platelets: 249 10*3/uL (ref 150–450)
RBC: 4.99 x10E6/uL (ref 4.14–5.80)
RDW: 13.4 % (ref 11.6–15.4)
WBC: 8.5 10*3/uL (ref 3.4–10.8)

## 2022-09-13 LAB — LIPID PANEL
Chol/HDL Ratio: 2.5 ratio (ref 0.0–5.0)
Cholesterol, Total: 131 mg/dL (ref 100–199)
HDL: 53 mg/dL (ref >39)
LDL Chol Calc (NIH): 27 mg/dL (ref 0–99)
Triglycerides: 357 mg/dL — ABNORMAL HIGH (ref 0–149)
VLDL Cholesterol Cal: 51 mg/dL — ABNORMAL HIGH (ref 5–40)

## 2022-09-13 LAB — COMPREHENSIVE METABOLIC PANEL
ALT: 27 IU/L (ref 0–44)
AST: 27 IU/L (ref 0–40)
Albumin/Globulin Ratio: 1.7 (ref 1.2–2.2)
Albumin: 4.1 g/dL (ref 4.1–5.1)
Alkaline Phosphatase: 75 IU/L (ref 44–121)
BUN/Creatinine Ratio: 11 (ref 9–20)
BUN: 11 mg/dL (ref 6–24)
Bilirubin Total: 0.5 mg/dL (ref 0.0–1.2)
CO2: 21 mmol/L (ref 20–29)
Calcium: 9.4 mg/dL (ref 8.7–10.2)
Chloride: 102 mmol/L (ref 96–106)
Creatinine, Ser: 0.96 mg/dL (ref 0.76–1.27)
Globulin, Total: 2.4 g/dL (ref 1.5–4.5)
Glucose: 103 mg/dL — ABNORMAL HIGH (ref 70–99)
Potassium: 4.2 mmol/L (ref 3.5–5.2)
Sodium: 139 mmol/L (ref 134–144)
Total Protein: 6.5 g/dL (ref 6.0–8.5)
eGFR: 97 mL/min/{1.73_m2} (ref >59)

## 2022-09-13 LAB — APO A1 + B + RATIO
Apolipo. B/A-1 Ratio: 0.3 ratio (ref 0.0–0.7)
Apolipoprotein A-1: 180 mg/dL — ABNORMAL HIGH (ref 101–178)
Apolipoprotein B: 54 mg/dL (ref <90)

## 2022-09-13 LAB — TSH: TSH: 5.12 u[IU]/mL — ABNORMAL HIGH (ref 0.450–4.500)

## 2022-09-13 LAB — T4, FREE: Free T4: 1.08 ng/dL (ref 0.82–1.77)

## 2022-09-13 LAB — HEMOGLOBIN A1C
Est. average glucose Bld gHb Est-mCnc: 123 mg/dL
Hgb A1c MFr Bld: 5.9 % — ABNORMAL HIGH (ref 4.8–5.6)

## 2022-09-13 LAB — T3, FREE: T3, Free: 4.1 pg/mL (ref 2.0–4.4)

## 2022-09-16 ENCOUNTER — Encounter: Payer: Self-pay | Admitting: Family Medicine

## 2022-09-16 ENCOUNTER — Ambulatory Visit: Payer: BC Managed Care – PPO | Admitting: Family Medicine

## 2022-09-16 ENCOUNTER — Other Ambulatory Visit: Payer: Self-pay | Admitting: Family Medicine

## 2022-09-16 VITALS — BP 118/80 | HR 98 | Ht 73.0 in | Wt 272.0 lb

## 2022-09-16 DIAGNOSIS — G4733 Obstructive sleep apnea (adult) (pediatric): Secondary | ICD-10-CM

## 2022-09-16 DIAGNOSIS — E782 Mixed hyperlipidemia: Secondary | ICD-10-CM | POA: Diagnosis not present

## 2022-09-16 DIAGNOSIS — E038 Other specified hypothyroidism: Secondary | ICD-10-CM

## 2022-09-16 MED ORDER — ICOSAPENT ETHYL 1 G PO CAPS: 2.0000 g | ORAL_CAPSULE | Freq: Two times a day (BID) | ORAL | 0 refills | Status: DC

## 2022-09-16 NOTE — Telephone Encounter (Signed)
Rx was dc'd today by provider. Pt is one another HLD medication.   Requested Prescriptions  Refused Prescriptions Disp Refills   fenofibrate micronized (ANTARA) 43 MG capsule [Pharmacy Med Name: FENOFIBRATE 43MG CAPSULES] 90 capsule 0    Sig: TAKE 1 CAPSULE(43 MG) BY MOUTH DAILY BEFORE BREAKFAST     Cardiovascular:  Antilipid - Fibric Acid Derivatives Failed - 09/16/2022  3:29 AM      Failed - Lipid Panel in normal range within the last 12 months    Cholesterol, Total  Date Value Ref Range Status  09/12/2022 131 100 - 199 mg/dL Final   LDL Chol Calc (NIH)  Date Value Ref Range Status  09/12/2022 27 0 - 99 mg/dL Final   Direct LDL  Date Value Ref Range Status  04/03/2017 89.0 mg/dL Final    Comment:    Optimal:  <100 mg/dLNear or Above Optimal:  100-129 mg/dLBorderline High:  130-159 mg/dLHigh:  160-189 mg/dLVery High:  >190 mg/dL   HDL  Date Value Ref Range Status  09/12/2022 53 >39 mg/dL Final   Triglycerides  Date Value Ref Range Status  09/12/2022 357 (H) 0 - 149 mg/dL Final         Passed - ALT in normal range and within 360 days    ALT  Date Value Ref Range Status  09/12/2022 27 0 - 44 IU/L Final         Passed - AST in normal range and within 360 days    AST  Date Value Ref Range Status  09/12/2022 27 0 - 40 IU/L Final         Passed - Cr in normal range and within 360 days    Creatinine, Ser  Date Value Ref Range Status  09/12/2022 0.96 0.76 - 1.27 mg/dL Final         Passed - HGB in normal range and within 360 days    Hemoglobin  Date Value Ref Range Status  09/12/2022 14.9 13.0 - 17.7 g/dL Final         Passed - HCT in normal range and within 360 days    Hematocrit  Date Value Ref Range Status  09/12/2022 44.8 37.5 - 51.0 % Final         Passed - PLT in normal range and within 360 days    Platelets  Date Value Ref Range Status  09/12/2022 249 150 - 450 x10E3/uL Final         Passed - WBC in normal range and within 360 days    WBC  Date  Value Ref Range Status  09/12/2022 8.5 3.4 - 10.8 x10E3/uL Final  04/03/2017 8.4 4.0 - 10.5 K/uL Final         Passed - eGFR is 30 or above and within 360 days    GFR  Date Value Ref Range Status  04/03/2017 82.53 >60.00 mL/min Final   eGFR  Date Value Ref Range Status  09/12/2022 97 >59 mL/min/1.73 Final         Passed - Valid encounter within last 12 months    Recent Outpatient Visits           Today Subclinical hypothyroidism   [place] [facility] at [place], Earley Abide, MD   2 months ago Mixed hyperlipidemia   [place] [facility] at [place], Earley Abide, MD   9 months ago Annual physical exam   [place]  and Sports Medicine at [place], MD       Future Appointments             In 12 months Zigmund Daniel, Earley Abide, MD [facility] and Sports Medicine at [facility], [facility]

## 2022-09-16 NOTE — Patient Instructions (Signed)
-   see endo about thyroid - review weight loss medications - lateral epicond exercises and voltaren gel  1 year physical

## 2022-09-21 ENCOUNTER — Other Ambulatory Visit: Payer: Self-pay | Admitting: Family Medicine

## 2022-09-21 ENCOUNTER — Encounter: Payer: Self-pay | Admitting: Family Medicine

## 2022-09-21 ENCOUNTER — Other Ambulatory Visit: Payer: Self-pay

## 2022-09-21 DIAGNOSIS — E782 Mixed hyperlipidemia: Secondary | ICD-10-CM

## 2022-09-21 MED ORDER — FENOFIBRATE MICRONIZED 43 MG PO CAPS: 43.0000 mg | ORAL_CAPSULE | Freq: Every day | ORAL | 2 refills | Status: DC

## 2022-09-21 NOTE — Telephone Encounter (Signed)
Refill sent.

## 2022-09-21 NOTE — Telephone Encounter (Signed)
Please advise, I will send in the medicaiton if pt advised to  stay on it

## 2022-09-26 DIAGNOSIS — E038 Other specified hypothyroidism: Secondary | ICD-10-CM | POA: Insufficient documentation

## 2022-09-26 DIAGNOSIS — E039 Hypothyroidism, unspecified: Secondary | ICD-10-CM | POA: Insufficient documentation

## 2022-09-26 NOTE — Assessment & Plan Note (Signed)
Noted on labs however he is rather symptomatic with recalcitrant fatigue despite CPAP usage, will refer to endocrinology for their input.

## 2022-09-26 NOTE — Progress Notes (Signed)
     Primary Care / Sports Medicine Office Visit  Patient Information:  Patient ID: Louis Marquez, male DOB: [birth year] Age: 52 y.o. MRN: [id number]   Louis Marquez is a pleasant 52 y.o. male presenting with the following:  Chief Complaint  Patient presents with   Follow-up    Being seen by cardiology, here for lab follow up    Vitals:   09/16/22 1104  BP: 118/80  Pulse: 98  SpO2: 98%   Vitals:   09/16/22 1104  Weight: 272 lb (123.4 kg)  Height: 6\' 1"  (1.854 m)   Body mass index is 35.89 kg/m.     Independent interpretation of notes and tests performed by another provider:   None  Procedures performed:   None  Pertinent History, Exam, Impression, and Recommendations:   Problem List Items Addressed This Visit       Respiratory   OSA (obstructive sleep apnea)    Chronic with control utilizing CPAP.        Endocrine   Subclinical hypothyroidism - Primary    Noted on labs however he is rather symptomatic with recalcitrant fatigue despite CPAP usage, will refer to endocrinology for their input.      Relevant Orders   Ambulatory referral to Endocrinology     Other   Hyperlipidemia   Relevant Medications   icosapent Ethyl (VASCEPA) 1 g capsule     Orders & Medications Meds ordered this encounter  Medications   icosapent Ethyl (VASCEPA) 1 g capsule    Sig: Take 2 capsules (2 g total) by mouth 2 (two) times daily.    Dispense:  360 capsule    Refill:  0    Diagnoses: Primary CVD prevention, hypertriglyceridemia   Orders Placed This Encounter  Procedures   Ambulatory referral to Endocrinology     Return in about 52 weeks (around 09/15/2023) for cpe.     [name], MD, [facility]   Primary Care Sports Medicine Primary Care and Sports Medicine at [facility]

## 2022-09-26 NOTE — Assessment & Plan Note (Signed)
Chronic with control utilizing CPAP.

## 2022-10-10 ENCOUNTER — Other Ambulatory Visit: Payer: Self-pay

## 2022-10-10 DIAGNOSIS — Z6835 Body mass index (BMI) 35.0-35.9, adult: Secondary | ICD-10-CM

## 2022-10-10 MED ORDER — WEGOVY 0.25 MG/0.5ML ~~LOC~~ SOAJ: 0.2500 mg | SUBCUTANEOUS | 0 refills | Status: DC

## 2022-10-10 NOTE — Telephone Encounter (Signed)
Please advise 

## 2022-10-14 DIAGNOSIS — E038 Other specified hypothyroidism: Secondary | ICD-10-CM | POA: Diagnosis not present

## 2022-10-20 DIAGNOSIS — E038 Other specified hypothyroidism: Secondary | ICD-10-CM | POA: Diagnosis not present

## 2022-10-31 NOTE — Telephone Encounter (Signed)
Please advise on the 2.4 dose doe Wegovy (last message)

## 2022-12-05 ENCOUNTER — Encounter: Payer: Self-pay | Admitting: Family Medicine

## 2022-12-05 ENCOUNTER — Other Ambulatory Visit: Payer: Self-pay

## 2022-12-05 DIAGNOSIS — E782 Mixed hyperlipidemia: Secondary | ICD-10-CM

## 2022-12-05 MED ORDER — ROSUVASTATIN CALCIUM 10 MG PO TABS: 10.0000 mg | ORAL_TABLET | Freq: Every day | ORAL | 3 refills | Status: DC

## 2022-12-23 ENCOUNTER — Other Ambulatory Visit: Payer: Self-pay | Admitting: Family Medicine

## 2022-12-23 DIAGNOSIS — E782 Mixed hyperlipidemia: Secondary | ICD-10-CM

## 2022-12-23 NOTE — Telephone Encounter (Signed)
Please advise 

## 2022-12-23 NOTE — Telephone Encounter (Signed)
Requested medication (s) are due for refill today: yes  Requested medication (s) are on the active medication list: yes    Last refill: 09/16/22  #360  0 refills  Future visit scheduled yes 09/15/23  Notes to clinic:Off protocol, please review. Thank you.  Requested Prescriptions  Pending Prescriptions Disp Refills   icosapent Ethyl (VASCEPA) 1 g capsule [Pharmacy Med Name: ICOSAPENT ETHYL 1GM CAPSULES] 360 capsule 0    Sig: TAKE 2 CAPSULES(2 GRAMS) BY MOUTH TWICE DAILY     Off-Protocol Failed - 12/23/2022  3:30 AM      Failed - Medication not assigned to a protocol, review manually.      Passed - Valid encounter within last 12 months    Recent Outpatient Visits           3 months ago Subclinical hypothyroidism    [facility] & Sports Medicine at [place], Earley Abide, MD   6 months ago Mixed hyperlipidemia   [facility] Primary [facility] & Sports Medicine at [place], Earley Abide, MD   1 year ago Annual physical exam   [place] [facility] [place], MD       Future Appointments             In 8 months Zigmund Daniel, Earley Abide, MD [place] at [facility], [facility]

## 2023-05-22 ENCOUNTER — Encounter (INDEPENDENT_AMBULATORY_CARE_PROVIDER_SITE_OTHER): Payer: BC Managed Care – PPO | Admitting: Family Medicine

## 2023-05-22 ENCOUNTER — Other Ambulatory Visit: Payer: Self-pay | Admitting: Family Medicine

## 2023-05-22 DIAGNOSIS — Z7689 Persons encountering health services in other specified circumstances: Secondary | ICD-10-CM | POA: Diagnosis not present

## 2023-05-22 DIAGNOSIS — E782 Mixed hyperlipidemia: Secondary | ICD-10-CM

## 2023-05-22 MED ORDER — ICOSAPENT ETHYL 1 G PO CAPS: 2.0000 g | ORAL_CAPSULE | Freq: Two times a day (BID) | ORAL | 3 refills | Status: DC

## 2023-05-22 NOTE — Telephone Encounter (Signed)
Please advise 

## 2023-05-23 NOTE — Telephone Encounter (Signed)
fyi

## 2023-05-24 ENCOUNTER — Other Ambulatory Visit: Payer: Self-pay | Admitting: Family Medicine

## 2023-05-24 DIAGNOSIS — Z7689 Persons encountering health services in other specified circumstances: Secondary | ICD-10-CM | POA: Insufficient documentation

## 2023-05-24 MED ORDER — ZEPBOUND 2.5 MG/0.5ML ~~LOC~~ SOAJ: 2.5000 mg | SUBCUTANEOUS | 2 refills | Status: DC

## 2023-05-24 NOTE — Telephone Encounter (Signed)
Please see the MyChart message reply(ies) for my assessment and plan.    This patient gave consent for this Medical Advice Message and is aware that it may result in a bill to their insurance company, as well as the possibility of receiving a bill for a co-payment or deductible. They are an established patient, but are not seeking medical advice exclusively about a problem treated during an in person or video visit in the last seven days. I did not recommend an in person or video visit within seven days of my reply.    I spent a total of 22 minutes cumulative time within 7 days through MyChart messaging.  Jason J Matthews, MD   

## 2023-08-13 ENCOUNTER — Encounter: Payer: Self-pay | Admitting: Family Medicine

## 2023-08-14 ENCOUNTER — Other Ambulatory Visit: Payer: Self-pay

## 2023-08-14 DIAGNOSIS — E782 Mixed hyperlipidemia: Secondary | ICD-10-CM

## 2023-08-14 MED ORDER — FENOFIBRATE MICRONIZED 43 MG PO CAPS: 43.0000 mg | ORAL_CAPSULE | Freq: Every day | ORAL | 0 refills | Status: DC

## 2023-09-07 NOTE — Telephone Encounter (Signed)
Please review.  KP

## 2023-09-11 ENCOUNTER — Other Ambulatory Visit: Payer: Self-pay | Admitting: Family Medicine

## 2023-09-11 DIAGNOSIS — I10 Essential (primary) hypertension: Secondary | ICD-10-CM | POA: Diagnosis not present

## 2023-09-11 DIAGNOSIS — E559 Vitamin D deficiency, unspecified: Secondary | ICD-10-CM

## 2023-09-11 DIAGNOSIS — E669 Obesity, unspecified: Secondary | ICD-10-CM | POA: Diagnosis not present

## 2023-09-11 DIAGNOSIS — Z131 Encounter for screening for diabetes mellitus: Secondary | ICD-10-CM

## 2023-09-11 DIAGNOSIS — I2089 Other forms of angina pectoris: Secondary | ICD-10-CM | POA: Diagnosis not present

## 2023-09-11 DIAGNOSIS — R7301 Impaired fasting glucose: Secondary | ICD-10-CM

## 2023-09-11 DIAGNOSIS — E781 Pure hyperglyceridemia: Secondary | ICD-10-CM

## 2023-09-11 DIAGNOSIS — E038 Other specified hypothyroidism: Secondary | ICD-10-CM

## 2023-09-11 DIAGNOSIS — Z125 Encounter for screening for malignant neoplasm of prostate: Secondary | ICD-10-CM

## 2023-09-11 DIAGNOSIS — E079 Disorder of thyroid, unspecified: Secondary | ICD-10-CM

## 2023-09-11 DIAGNOSIS — R0602 Shortness of breath: Secondary | ICD-10-CM | POA: Diagnosis not present

## 2023-09-11 DIAGNOSIS — E782 Mixed hyperlipidemia: Secondary | ICD-10-CM

## 2023-09-11 DIAGNOSIS — Z0000 Encounter for general adult medical examination without abnormal findings: Secondary | ICD-10-CM

## 2023-09-12 DIAGNOSIS — E559 Vitamin D deficiency, unspecified: Secondary | ICD-10-CM | POA: Diagnosis not present

## 2023-09-12 DIAGNOSIS — Z125 Encounter for screening for malignant neoplasm of prostate: Secondary | ICD-10-CM | POA: Diagnosis not present

## 2023-09-12 DIAGNOSIS — Z131 Encounter for screening for diabetes mellitus: Secondary | ICD-10-CM | POA: Diagnosis not present

## 2023-09-12 DIAGNOSIS — R7301 Impaired fasting glucose: Secondary | ICD-10-CM | POA: Diagnosis not present

## 2023-09-12 DIAGNOSIS — E038 Other specified hypothyroidism: Secondary | ICD-10-CM | POA: Diagnosis not present

## 2023-09-12 DIAGNOSIS — E782 Mixed hyperlipidemia: Secondary | ICD-10-CM | POA: Diagnosis not present

## 2023-09-12 DIAGNOSIS — E781 Pure hyperglyceridemia: Secondary | ICD-10-CM | POA: Diagnosis not present

## 2023-09-12 DIAGNOSIS — E079 Disorder of thyroid, unspecified: Secondary | ICD-10-CM | POA: Diagnosis not present

## 2023-09-12 DIAGNOSIS — Z0000 Encounter for general adult medical examination without abnormal findings: Secondary | ICD-10-CM | POA: Diagnosis not present

## 2023-09-13 LAB — COMPREHENSIVE METABOLIC PANEL
ALT: 23 [IU]/L (ref 0–44)
AST: 24 [IU]/L (ref 0–40)
Albumin: 4.4 g/dL (ref 4.1–5.1)
Alkaline Phosphatase: 80 [IU]/L (ref 44–121)
BUN/Creatinine Ratio: 15 (ref 9–20)
BUN: 15 mg/dL (ref 6–24)
Bilirubin Total: 0.4 mg/dL (ref 0.0–1.2)
CO2: 25 mmol/L (ref 20–29)
Calcium: 9.2 mg/dL (ref 8.7–10.2)
Chloride: 104 mmol/L (ref 96–106)
Creatinine, Ser: 1.02 mg/dL (ref 0.76–1.27)
Globulin, Total: 2.3 g/dL (ref 1.5–4.5)
Glucose: 106 mg/dL — ABNORMAL HIGH (ref 70–99)
Potassium: 4.5 mmol/L (ref 3.5–5.2)
Sodium: 143 mmol/L (ref 134–144)
Total Protein: 6.7 g/dL (ref 6.0–8.5)
eGFR: 90 mL/min/{1.73_m2} (ref >59)

## 2023-09-13 LAB — APO A1 + B + RATIO
Apolipo. B/A-1 Ratio: 0.4 {ratio} (ref 0.0–0.7)
Apolipoprotein A-1: 156 mg/dL (ref 101–178)
Apolipoprotein B: 66 mg/dL (ref <90)

## 2023-09-13 LAB — LIPID PANEL
Chol/HDL Ratio: 3.1 {ratio} (ref 0.0–5.0)
Cholesterol, Total: 136 mg/dL (ref 100–199)
HDL: 44 mg/dL (ref >39)
LDL Chol Calc (NIH): 42 mg/dL (ref 0–99)
Triglycerides: 332 mg/dL — ABNORMAL HIGH (ref 0–149)
VLDL Cholesterol Cal: 50 mg/dL — ABNORMAL HIGH (ref 5–40)

## 2023-09-13 LAB — CBC
Hematocrit: 45.6 % (ref 37.5–51.0)
Hemoglobin: 15.1 g/dL (ref 13.0–17.7)
MCH: 30 pg (ref 26.6–33.0)
MCHC: 33.1 g/dL (ref 31.5–35.7)
MCV: 91 fL (ref 79–97)
Platelets: 259 10*3/uL (ref 150–450)
RBC: 5.04 x10E6/uL (ref 4.14–5.80)
RDW: 13.1 % (ref 11.6–15.4)
WBC: 7.1 10*3/uL (ref 3.4–10.8)

## 2023-09-13 LAB — T4, FREE: Free T4: 1.03 ng/dL (ref 0.82–1.77)

## 2023-09-13 LAB — PSA TOTAL (REFLEX TO FREE): Prostate Specific Ag, Serum: 0.8 ng/mL (ref 0.0–4.0)

## 2023-09-13 LAB — HEMOGLOBIN A1C
Est. average glucose Bld gHb Est-mCnc: 126 mg/dL
Hgb A1c MFr Bld: 6 % — ABNORMAL HIGH (ref 4.8–5.6)

## 2023-09-13 LAB — T3, FREE: T3, Free: 4.3 pg/mL (ref 2.0–4.4)

## 2023-09-13 LAB — TSH: TSH: 7.41 u[IU]/mL — ABNORMAL HIGH (ref 0.450–4.500)

## 2023-09-13 LAB — VITAMIN D 25 HYDROXY (VIT D DEFICIENCY, FRACTURES): Vit D, 25-Hydroxy: 28.2 ng/mL — ABNORMAL LOW (ref 30.0–100.0)

## 2023-09-15 ENCOUNTER — Encounter: Payer: BC Managed Care – PPO | Admitting: Family Medicine

## 2023-09-18 ENCOUNTER — Encounter: Payer: Self-pay | Admitting: Family Medicine

## 2023-09-18 ENCOUNTER — Ambulatory Visit (INDEPENDENT_AMBULATORY_CARE_PROVIDER_SITE_OTHER): Payer: BC Managed Care – PPO | Admitting: Family Medicine

## 2023-09-18 VITALS — BP 118/100 | HR 106 | Ht 73.0 in | Wt 273.8 lb

## 2023-09-18 DIAGNOSIS — M1611 Unilateral primary osteoarthritis, right hip: Secondary | ICD-10-CM | POA: Diagnosis not present

## 2023-09-18 DIAGNOSIS — G4733 Obstructive sleep apnea (adult) (pediatric): Secondary | ICD-10-CM

## 2023-09-18 DIAGNOSIS — Z0000 Encounter for general adult medical examination without abnormal findings: Secondary | ICD-10-CM

## 2023-09-18 DIAGNOSIS — R7303 Prediabetes: Secondary | ICD-10-CM

## 2023-09-18 DIAGNOSIS — E782 Mixed hyperlipidemia: Secondary | ICD-10-CM | POA: Diagnosis not present

## 2023-09-18 DIAGNOSIS — E559 Vitamin D deficiency, unspecified: Secondary | ICD-10-CM

## 2023-09-18 DIAGNOSIS — E038 Other specified hypothyroidism: Secondary | ICD-10-CM | POA: Diagnosis not present

## 2023-09-18 MED ORDER — VITAMIN D (ERGOCALCIFEROL) 1.25 MG (50000 UNIT) PO CAPS: 50000.0000 [IU] | ORAL_CAPSULE | ORAL | 0 refills | Status: DC

## 2023-09-18 NOTE — Assessment & Plan Note (Signed)
Prediabetes A1c in prediabetic range. Discussed the importance of preventing progression to type II diabetes. -Advised on adjustments to lifestyle and potential medication changes. -Check A1c in 6 months.

## 2023-09-18 NOTE — Assessment & Plan Note (Signed)
The patient's early onset arthritis in the right hip (recently diagnosed via x-ray at outside orthopedic group) has been causing increased pain, particularly during certain movements.  Able to continue sports, athletics.  Right hip osteoarthritis Discussed potential benefits of joint health supplements and strengthening exercises. -Consider home-based hip conditioning program. -Consider joint health supplements. -Consider further intervention if pain worsens (e.g., PRP, steroid shot, etc).

## 2023-09-18 NOTE — Assessment & Plan Note (Signed)
Vitamin D Deficiency Discussed the benefits of vitamin D supplementation for energy, mood, and potential pain reduction. -Start 8-week course of vitamin D supplementation. -After completion, continue with daily over-the-counter vitamin D supplement.

## 2023-09-18 NOTE — Patient Instructions (Addendum)
-   Obtain fasting labs with orders provided (can have water or black coffee but otherwise no food or drink x 8 hours before labs) - Review information provided - Attend eye doctor annually, dentist every 6 months, work towards or maintain 30 minutes of moderate intensity physical activity at least 5 days per week, and consume a balanced diet - Contact us for any questions between now and then  Additionally:  YOUR PLAN:  - prediabetes: Prevent progression to diabetes with lifestyle changes and possible medication adjustments. Recheck A1c in 6 months.  - subclinical hypothyroidism: Restart Synthroid treatment as guided by an endocrinologist. Monitor symptoms for improvement.  - vitamin D deficiency: Begin an 8-week vitamin D supplementation course, then transition to a daily over-the-counter supplement.  - hyperlipidemia: Maintain current cholesterol management and consider adjustments if needed due to prediabetes.   - joint pain: Use joint health supplements and strengthening exercises (see below) ; consider further interventions like PRP or steroid shots if pain worsens.  Hip exercises: [email or web address]  Joint health supplements

## 2023-09-18 NOTE — Assessment & Plan Note (Signed)
Annual examination completed, risk stratification labs ordered, anticipatory guidance provided.  We will follow labs once resulted. 

## 2023-09-18 NOTE — Progress Notes (Signed)
Annual Physical Exam Visit  Patient Information:  Patient ID: Louis Marquez, male DOB: 1971/05/13 Age: 52 y.o. MRN: [id number]   Subjective:   CC: Annual Physical Exam  HPI:  Louis Marquez is here for their annual physical.  I reviewed the past medical history, family history, social history, surgical history, and allergies today and changes were made as necessary.  Please see the problem list section below for additional details.  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"   Family History: Family History  Problem Relation Age of Onset   Heart murmur Mother    Allergies Father    Atrial fibrillation Father    Varicose Veins Father    Liver cancer Father    Hyperthyroidism Father    Brain cancer Paternal Grandfather    Allergies: No Known Allergies Health Maintenance: Health Maintenance  Topic Date Due   Colonoscopy  07/21/2023   COVID-19 Vaccine (3 - 2024-25 season) 10/04/2023 (Originally 05/28/2023)   Zoster Vaccines- Shingrix (1 of 2) 12/17/2023 (Originally 06/09/2023)   INFLUENZA VACCINE  12/25/2023 (Originally 04/27/2023)   DTaP/Tdap/Td (2 - Td or Tdap) 09/17/2024 (Originally 07/26/2023)   Hepatitis C Screening  Completed   HIV Screening  Completed   HPV VACCINES  Aged Out    HM Colonoscopy          Current Care Gaps     Colonoscopy (Yearly) Overdue since 07/21/2023    07/20/2022  Surgical Procedure:  COLONOSCOPY WITH PROPOFOL   Only the first 1 history entries have been loaded, but more history exists.               Medications: Current Outpatient Medications on File Prior to Visit  Medication Sig Dispense Refill   Biotin 69629 MCG TABS Take 10,000 mcg by mouth daily.     Cyanocobalamin (B-12 PO) Take 5,000 mcg by mouth daily.     fenofibrate micronized (ANTARA) 43 MG capsule Take 1 capsule (43 mg total) by mouth daily before breakfast. 90 capsule 0   icosapent Ethyl (VASCEPA) 1 g capsule Take 2 capsules (2 g total) by mouth 2 (two) times daily. 360 capsule 3   levocetirizine (XYZAL) 5 MG tablet Take 5 mg by mouth every evening.     omeprazole (PRILOSEC) 20 MG capsule Take 20 mg by mouth daily.     rosuvastatin (CRESTOR) 10 MG tablet Take 1 tablet (10 mg total) by mouth daily. 90 tablet 3   tirzepatide (ZEPBOUND) 2.5 MG/0.5ML Pen Inject 2.5 mg into the skin once a week. (Patient not taking: Reported on 09/18/2023) 2 mL 2   No current facility-administered medications on file prior to visit.    Objective:   Vitals:   09/18/23 0900  BP: (!) 118/100  Pulse: (!) 106  SpO2: 96%   Vitals:   09/18/23 0900  Weight: 273 lb 12.8 oz (124.2 kg)  Height: 6\' 1"  (1.854 m)   Body  mass index is 36.12 kg/m.  General: Well Developed, well nourished, and in no acute distress.  Neuro: Alert and oriented x3, extra-ocular muscles intact, sensation grossly intact. Cranial nerves II through XII are grossly intact, motor, sensory, and coordinative functions are intact. HEENT: Normocephalic, atraumatic, neck supple, no masses, no lymphadenopathy, thyroid nonenlarged. Oropharynx, nasopharynx, external ear canals are unremarkable. Skin: Warm and dry, no rashes noted.  Cardiac: Regular rate and rhythm, no murmurs rubs or gallops. No peripheral edema. Pulses symmetric. Respiratory: Clear to auscultation bilaterally. Speaking in full sentences.  Abdominal: Soft, nontender, nondistended, positive  bowel sounds, no masses, no organomegaly. Musculoskeletal: Stable, and with full range of motion.  Positive FADIR testing right, nontender greater trochanter.  Impression and Recommendations:   The patient was counselled, risk factors were discussed, and anticipatory guidance given.  Problem List Items Addressed This Visit       Respiratory   OSA (obstructive sleep apnea)   Self discontinued CPAP.  Recent FDA approval for tirzepatide for OSA.  -Reach out to ENT regarding potential use of tirzepatide.        Endocrine   Subclinical hypothyroidism   Subclinical Hypothyroidism Elevated TSH with normal T3 and T4. Discussed potential symptoms and the benefits of treatment to normalize TSH. -Follow-up with endocrinologist for restart of Synthroid.        Musculoskeletal and Integument   Primary osteoarthritis of right hip   The patient's early onset arthritis in the right hip (recently diagnosed via x-ray at outside orthopedic group) has been causing increased pain, particularly during certain movements.  Able to continue sports, athletics.  Right hip osteoarthritis Discussed potential benefits of joint health supplements and strengthening exercises. -Consider home-based hip conditioning program. -Consider joint health supplements. -Consider further intervention if pain worsens (e.g., PRP, steroid shot, etc).        Other   Healthcare maintenance - Primary   Annual examination completed, risk stratification labs ordered, anticipatory guidance provided.  We will follow labs once resulted.      Hyperlipidemia   Hyperlipidemia Stable cholesterol levels with slight increase in LDL. Discussed the importance of maintaining good cholesterol control in the context of prediabetes. -Continue current management. -Consider potential medication adjustments.      Prediabetes   Prediabetes A1c in prediabetic range. Discussed the importance of preventing progression to type II  diabetes. -Advised on adjustments to lifestyle and potential medication changes. -Check A1c in 6 months.      Vitamin D deficiency   Vitamin D Deficiency Discussed the benefits of vitamin D supplementation for energy, mood, and potential pain reduction. -Start 8-week course of vitamin D supplementation. -After completion, continue with daily over-the-counter vitamin D supplement.      Relevant Medications   Vitamin D, Ergocalciferol, (DRISDOL) 1.25 MG (50000 UNIT) CAPS capsule     Orders & Medications Medications:  Meds ordered this encounter  Medications   Vitamin D, Ergocalciferol, (DRISDOL) 1.25 MG (50000 UNIT) CAPS capsule    Sig: Take 1 capsule (50,000 Units total) by mouth every 7 (seven) days. Take for 8 total doses(weeks)    Dispense:  8 capsule    Refill:  0   No orders of the defined types were placed in this encounter.    Return in about 6 months (around 03/18/2024) for video visit.    Jerrol Banana, MD, [facility]   Primary Care Sports Medicine Primary Care and Sports Medicine at [facility]

## 2023-09-18 NOTE — Assessment & Plan Note (Signed)
Subclinical Hypothyroidism Elevated TSH with normal T3 and T4. Discussed potential symptoms and the benefits of treatment to normalize TSH. -Follow-up with endocrinologist for restart of Synthroid.

## 2023-09-18 NOTE — Assessment & Plan Note (Signed)
Self discontinued CPAP.  Recent FDA approval for tirzepatide for OSA.  -Reach out to ENT regarding potential use of tirzepatide.

## 2023-09-18 NOTE — Assessment & Plan Note (Signed)
Hyperlipidemia Stable cholesterol levels with slight increase in LDL. Discussed the importance of maintaining good cholesterol control in the context of prediabetes. -Continue current management. -Consider potential medication adjustments.

## 2023-09-19 ENCOUNTER — Encounter: Payer: Self-pay | Admitting: Family Medicine

## 2023-09-26 ENCOUNTER — Other Ambulatory Visit: Payer: Self-pay | Admitting: Family Medicine

## 2023-09-26 DIAGNOSIS — Z6836 Body mass index (BMI) 36.0-36.9, adult: Secondary | ICD-10-CM | POA: Insufficient documentation

## 2023-09-26 DIAGNOSIS — G4733 Obstructive sleep apnea (adult) (pediatric): Secondary | ICD-10-CM

## 2023-09-26 DIAGNOSIS — Z7689 Persons encountering health services in other specified circumstances: Secondary | ICD-10-CM

## 2023-09-26 DIAGNOSIS — Z6833 Body mass index (BMI) 33.0-33.9, adult: Secondary | ICD-10-CM | POA: Insufficient documentation

## 2023-09-26 MED ORDER — ZEPBOUND 2.5 MG/0.5ML ~~LOC~~ SOAJ: 2.5000 mg | SUBCUTANEOUS | 2 refills | Status: DC

## 2023-09-26 NOTE — Telephone Encounter (Signed)
Please review.  KP

## 2023-10-03 ENCOUNTER — Telehealth: Payer: Self-pay

## 2023-10-03 ENCOUNTER — Telehealth: Payer: Self-pay | Admitting: Family Medicine

## 2023-10-03 NOTE — Telephone Encounter (Signed)
 Copied from CRM [id number]. Topic: General - Other >> Oct 03, 2023  3:01 PM Dominique A wrote: Reason for CRM: Pt is calling back to speak with Chassidy regarding his Prior Authorization for his medication. Pt states that he spoke with his [employer] and the only thing that changed is the Group number, where the 0 is it changed to the letter C for [employer]. Pt is wanting Chassidy to call him back.

## 2023-10-03 NOTE — Telephone Encounter (Signed)
 Called to complete a PA over the phone - The customer service rep stated he is having issues with his system but he submitted the PA in the portal and they will contact our office further with any clinical questions.  He could not give me a reference #. He said he added this as a activity log and its documented under the patients plan.  Will await further instructions from insurance.  - Louis Marquez

## 2023-10-09 NOTE — Telephone Encounter (Signed)
(  Key: [id number])   "This request cannot be processed due to the medication is not covered by the plan."  Sent patient a message informing.

## 2023-10-23 ENCOUNTER — Encounter: Payer: Self-pay | Admitting: Family Medicine

## 2023-10-23 ENCOUNTER — Other Ambulatory Visit: Payer: Self-pay

## 2023-10-23 DIAGNOSIS — E782 Mixed hyperlipidemia: Secondary | ICD-10-CM

## 2023-10-23 MED ORDER — ROSUVASTATIN CALCIUM 10 MG PO TABS: 10.0000 mg | ORAL_TABLET | Freq: Every day | ORAL | 3 refills | Status: AC

## 2023-11-08 ENCOUNTER — Other Ambulatory Visit: Payer: Self-pay | Admitting: Family Medicine

## 2023-11-08 ENCOUNTER — Encounter: Payer: Self-pay | Admitting: Family Medicine

## 2023-11-08 DIAGNOSIS — E782 Mixed hyperlipidemia: Secondary | ICD-10-CM

## 2023-11-08 MED ORDER — FENOFIBRATE MICRONIZED 43 MG PO CAPS: 43.0000 mg | ORAL_CAPSULE | Freq: Every day | ORAL | 0 refills | Status: DC

## 2023-11-14 ENCOUNTER — Other Ambulatory Visit: Payer: Self-pay | Admitting: Family Medicine

## 2023-11-14 DIAGNOSIS — E559 Vitamin D deficiency, unspecified: Secondary | ICD-10-CM

## 2023-11-14 NOTE — Telephone Encounter (Signed)
Requested medication (s) are due for refill today - provider review   Requested medication (s) are on the active medication list -yes  Future visit scheduled -yes  Last refill: 09/18/23 #8  Notes to clinic: high dose Rx- provider review   Requested Prescriptions  Pending Prescriptions Disp Refills   Vitamin D, Ergocalciferol, (DRISDOL) 1.25 MG (50000 UNIT) CAPS capsule [Pharmacy Med Name: VITAMIN D2 50,000IU (ERGO) CAP RX] 8 capsule 0    Sig: TAKE 1 CAPSULE BY MOUTH EVERY 7 DAYS     Endocrinology:  Vitamins - Vitamin D Supplementation 2 Failed - 11/14/2023  4:28 PM      Failed - Manual Review: Route requests for 50,000 IU strength to the provider      Failed - Vitamin D in normal range and within 360 days    Vit D, 25-Hydroxy  Date Value Ref Range Status  09/12/2023 28.2 (L) 30.0 - 100.0 ng/mL Final    Comment:    Vitamin D deficiency has been defined by the Institute of Medicine and an Endocrine Society practice guideline as a level of serum 25-OH vitamin D less than 20 ng/mL (1,2). The Endocrine Society went on to further define vitamin D insufficiency as a level between 21 and 29 ng/mL (2). 1. IOM (Institute of Medicine). 2010. Dietary reference    intakes for calcium and D. [place]: The    [employer]. 2. Holick MF, Binkley [place], Bischoff-Ferrari HA, et al.    Evaluation, treatment, and prevention of vitamin D    deficiency: an Endocrine Society clinical practice    guideline. JCEM. 2011 Jul; 96(7):1911-30.          Passed - Ca in normal range and within 360 days    Calcium  Date Value Ref Range Status  09/12/2023 9.2 8.7 - 10.2 mg/dL Final         Passed - Valid encounter within last 12 months    Recent Outpatient Visits           1 month ago Healthcare maintenance   [facility] Primary Care & Sports Medicine at [facility], Ocie Bob, MD   1 year ago Subclinical hypothyroidism   [place] Primary Care & Sports Medicine at  [facility], Ocie Bob, MD   1 year ago Mixed hyperlipidemia   [place] Primary Care & Sports Medicine at [facility], Ocie Bob, MD   1 year ago Annual physical exam   [facility] Primary Care & Sports Medicine at [facility], Ocie Bob, MD       Future Appointments             Tomorrow Jerrol Banana, MD [facility] Primary Care & Sports Medicine at [facility], [facility]   In 4 months Ashley Royalty, Ocie Bob, MD [facility] Primary Care & Sports Medicine at [facility], [facility]               Requested Prescriptions  Pending Prescriptions Disp Refills   Vitamin D, Ergocalciferol, (DRISDOL) 1.25 MG (50000 UNIT) CAPS capsule [Pharmacy Med Name: VITAMIN D2 50,000IU (ERGO) CAP RX] 8 capsule 0    Sig: TAKE 1 CAPSULE BY MOUTH EVERY 7 DAYS     Endocrinology:  Vitamins - Vitamin D Supplementation 2 Failed - 11/14/2023  4:28 PM      Failed - Manual Review: Route requests for 50,000 IU strength to the provider      Failed - Vitamin D in normal range  and within 360 days    Vit D, 25-Hydroxy  Date Value Ref Range Status  09/12/2023 28.2 (L) 30.0 - 100.0 ng/mL Final    Comment:    Vitamin D deficiency has been defined by the Institute of Medicine and an Endocrine Society practice guideline as a level of serum 25-OH vitamin D less than 20 ng/mL (1,2). The Endocrine Society went on to further define vitamin D insufficiency as a level between 21 and 29 ng/mL (2). 1. IOM (Institute of Medicine). 2010. Dietary reference    intakes for calcium and D. [place]: The    [employer]. 2. Holick MF, Binkley [place], Bischoff-Ferrari HA, et al.    Evaluation, treatment, and prevention of vitamin D    deficiency: an Endocrine Society clinical practice    guideline. JCEM. 2011 Jul; 96(7):1911-30.          Passed - Ca in normal range and within 360 days    Calcium  Date Value Ref Range Status  09/12/2023 9.2 8.7 - 10.2 mg/dL Final          Passed - Valid encounter within last 12 months    Recent Outpatient Visits           1 month ago Healthcare maintenance   [facility] Primary Care & Sports Medicine at [facility], Ocie Bob, MD   1 year ago Subclinical hypothyroidism   [place] Primary Care & Sports Medicine at [facility], Ocie Bob, MD   1 year ago Mixed hyperlipidemia   [facility] Primary Care & Sports Medicine at [facility], Ocie Bob, MD   1 year ago Annual physical exam   [facility] Primary Care & Sports Medicine at [facility], Ocie Bob, MD       Future Appointments             Tomorrow Jerrol Banana, MD [facility] Primary Care & Sports Medicine at [facility], [facility]   In 4 months Ashley Royalty, Ocie Bob, MD [facility] Primary Care & Sports Medicine at [facility], [facility]

## 2023-11-15 ENCOUNTER — Other Ambulatory Visit: Payer: Self-pay

## 2023-11-15 ENCOUNTER — Encounter: Payer: Self-pay | Admitting: Family Medicine

## 2023-11-15 ENCOUNTER — Telehealth: Payer: BC Managed Care – PPO | Admitting: Family Medicine

## 2023-11-15 DIAGNOSIS — Z713 Dietary counseling and surveillance: Secondary | ICD-10-CM

## 2023-11-15 DIAGNOSIS — E785 Hyperlipidemia, unspecified: Secondary | ICD-10-CM | POA: Diagnosis not present

## 2023-11-15 DIAGNOSIS — K5900 Constipation, unspecified: Secondary | ICD-10-CM | POA: Diagnosis not present

## 2023-11-15 DIAGNOSIS — Z7689 Persons encountering health services in other specified circumstances: Secondary | ICD-10-CM

## 2023-11-15 NOTE — Progress Notes (Signed)
Primary Care / Sports Medicine Virtual Visit  Patient Information:  Patient ID: SHA AMER, male DOB: [birth year] Age: 53 y.o. MRN: [id number]   BREVIN MCFADDEN is a pleasant 53 y.o. male presenting with the following:  Chief Complaint  Patient presents with   Weight Check    Stated Lost 12 lbs so far since last visit.     Review of Systems: No fevers, chills, night sweats, weight loss, chest pain, or shortness of breath.   Patient Active Problem List   Diagnosis Date Noted   BMI 36.0-36.9,adult 09/26/2023   Prediabetes 09/18/2023   Vitamin D deficiency 09/18/2023   Healthcare maintenance 09/18/2023   Primary osteoarthritis of right hip 09/18/2023   Encounter for weight management 05/24/2023   Subclinical hypothyroidism 09/26/2022   Hyperlipidemia    Annual physical exam 12/01/2021   Chronic fatigue 12/01/2021   Colon cancer screening 12/01/2021   OSA (obstructive sleep apnea) 12/01/2021   OBESITY 08/23/2010   GASTROESOPHAGEAL REFLUX DISEASE 08/23/2010   VARICES OF OTHER SITES 06/24/2008   Past Medical History:  Diagnosis Date   Achilles tendon tear, right, initial encounter 2009   Allergy    GERD (gastroesophageal reflux disease)    Hyperlipidemia    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   fenofibrate micronized (ANTARA) 43 MG capsule Take 1 capsule (43 mg total) by mouth daily before breakfast.   icosapent Ethyl (VASCEPA) 1 g capsule Take 2 capsules (2 g total) by mouth 2 (two) times daily.   levocetirizine (XYZAL) 5 MG tablet Take 5 mg by mouth every evening.   omeprazole (PRILOSEC) 20 MG capsule Take 20 mg by mouth daily.   rosuvastatin (CRESTOR) 10 MG tablet Take 1 tablet (10 mg total) by mouth daily.   tirzepatide (ZEPBOUND) 2.5 MG/0.5ML Pen Inject 2.5 mg into the skin once a week.   Biotin 40981 MCG TABS Take 10,000  mcg by mouth daily. (Patient not taking: Reported on 11/15/2023)   No facility-administered encounter medications on file as of 11/15/2023.   Past Surgical History:  Procedure Laterality Date   ACHILLES TENDON REPAIR Right 2009   COLONOSCOPY WITH PROPOFOL N/A 07/20/2022   Procedure: COLONOSCOPY WITH PROPOFOL;  Surgeon: Toney Reil, MD;  Location: [facility] ENDOSCOPY;  Service: Gastroenterology;  Laterality: N/A;  NO EARLIER; HAS TO "GET CHILDREN OFF TO SCHOOL"    Virtual Visit via MyChart Video:   I connected with Prescott Parma on 11/15/23 via MyChart Video and verified that I am speaking with the correct person using appropriate identifiers.   The limitations, risks, security and privacy concerns of performing an evaluation and management service by MyChart Video, including the higher likelihood of inaccurate diagnoses and treatments, and the availability of in person appointments were reviewed. The possible need of an additional face-to-face encounter for complete and high quality delivery of care was discussed. The patient was also made aware that there may be a patient responsible charge related to this service. The patient expressed understanding and wishes to proceed.  Provider location is in medical facility. Patient location is at their home, different from provider location. People involved in care of the patient during this telehealth encounter were myself, my nurse/medical assistant, and my front office/scheduling team member.  Objective findings:   General: Speaking full sentences, no audible heavy breathing. Sounds alert  and appropriately interactive. Well-appearing. Face symmetric. Extraocular movements intact. Pupils equal and round. No nasal flaring or accessory muscle use visualized.  Independent interpretation of notes and tests performed by another provider:   None  Pertinent History, Exam, Impression, and Recommendations:   Problem List Items Addressed This  Visit     Encounter for weight management - Primary   History of Present Illness ARBOR COHEN is a 53 year old male who presents for a follow-up on weight loss medication management.  He is currently on a compounded version of tirzepatide for weight loss, which he has been using for five weeks. He has lost 12 pounds during this period and is on a 5 mg dose, planning to increase to 7.5 mg. No tolerance issues or adverse reactions have been experienced with the medication.  He is obtaining the medication through [facility], a pharmacy in [place].  He has noticed a decrease in bowel movements, now having one per day compared to three per day previously, which he attributes to reduced food intake. No other significant side effects from the medication have been reported.  He is currently on Crestor, Vascepa, and fenofibrate for cholesterol and triglyceride management. He wants to reduce or stop these medications if his weight loss leads to improved lab results.  Assessment and Plan Weight Management Significant weight loss (12 pounds in 5 weeks) on Tirzepatide 5mg  weekly. No reported tolerance issues. Patient is considering increasing the dose to 7.5mg  weekly.  -Continue Tirzepatide 5mg  weekly. -Consider increasing dose to 7.5mg  weekly, monitoring for any stomach side effects. -Can find local ([place]) compounding pharmacy and then contact us for Rx. -If pursuing medical weight management we will schedule 3 month weight check after Rx.  Hyperlipidemia On Crestor and Vascepa. Discussed potential for discontinuation of these medications with sustained weight loss and improved lipid profile. -Plan to repeat lipid panel in 2-6 months while maintaining current medication regimen. -If lipid profile improves significantly, consider stepwise discontinuation of Crestor first, then Vascepa, and finally Fenofibrate, with repeat lipid panels after each discontinuation.  Constipation Reduced bowel  movements, likely secondary to decreased food intake and slowed gastric motility from Tirzepatide. -Encourage increased water intake, fruits, vegetables, and possibly Metamucil. -Ensure at least one bowel movement per day to prevent constipation complications.        Orders & Medications Medications: No orders of the defined types were placed in this encounter.  No orders of the defined types were placed in this encounter.    I discussed the above assessment and treatment plan with the patient. The patient was provided an opportunity to ask questions and all were answered. The patient agreed with the plan and demonstrated an understanding of the instructions.   The patient was advised to call back or seek an in-person evaluation if the symptoms worsen or if the condition fails to improve as anticipated.   I provided a total time of 30 minutes including both face-to-face and non-face-to-face time on 11/15/2023 inclusive of time utilized for medical chart review, information gathering, care coordination with staff, and documentation completion.    Jerrol Banana, MD, [facility]   Primary Care Sports Medicine Primary Care and Sports Medicine at [facility]

## 2023-11-15 NOTE — Assessment & Plan Note (Signed)
History of Present Illness Louis Marquez is a 54 year old male who presents for a follow-up on weight loss medication management.  He is currently on a compounded version of tirzepatide for weight loss, which he has been using for five weeks. He has lost 12 pounds during this period and is on a 5 mg dose, planning to increase to 7.5 mg. No tolerance issues or adverse reactions have been experienced with the medication.  He is obtaining the medication through [facility], a pharmacy in [place].  He has noticed a decrease in bowel movements, now having one per day compared to three per day previously, which he attributes to reduced food intake. No other significant side effects from the medication have been reported.  He is currently on Crestor, Vascepa, and fenofibrate for cholesterol and triglyceride management. He wants to reduce or stop these medications if his weight loss leads to improved lab results.  Assessment and Plan Weight Management Significant weight loss (12 pounds in 5 weeks) on Tirzepatide 5mg  weekly. No reported tolerance issues. Patient is considering increasing the dose to 7.5mg  weekly.  -Continue Tirzepatide 5mg  weekly. -Consider increasing dose to 7.5mg  weekly, monitoring for any stomach side effects. -Can find local ([place]) compounding pharmacy and then contact us for Rx. -If pursuing medical weight management we will schedule 3 month weight check after Rx.  Hyperlipidemia On Crestor and Vascepa. Discussed potential for discontinuation of these medications with sustained weight loss and improved lipid profile. -Plan to repeat lipid panel in 2-6 months while maintaining current medication regimen. -If lipid profile improves significantly, consider stepwise discontinuation of Crestor first, then Vascepa, and finally Fenofibrate, with repeat lipid panels after each discontinuation.  Constipation Reduced bowel movements, likely secondary to decreased food intake and slowed  gastric motility from Tirzepatide. -Encourage increased water intake, fruits, vegetables, and possibly Metamucil. -Ensure at least one bowel movement per day to prevent constipation complications.

## 2023-11-15 NOTE — Patient Instructions (Addendum)
Weight Management:  - Keep taking Tirzepatide 5mg  weekly. - Think about increasing to 7.5mg  weekly if you're comfortable, and watch for any stomach issues. - Find a local compounding pharmacy in [place] and let us know for a prescription. - We'll plan a 54-month weight check if you choose to continue with medical weight management.  Hyperlipidemia:  - Stay on Crestor and Vascepa for now. - We'll repeat your lipid panel in 2-6 months. - If your lipid levels improve, we might gradually stop Crestor first, then Vascepa, and finally Fenofibrate, checking lipid panels after each change.  Constipation:  - Drink more water and eat more fruits and vegetables. - Consider using Metamucil. - Aim for at least one bowel movement daily to avoid complications.  Constipation Stepwise Treatment  Find a consistent over-the-counter regimen that allows for at least 1-2 smooth, formed, bowel movements daily. See steps below and reserve Steps 5 and 6 for times without a bowel movement x 2-3 days despite regular OTC regimen.

## 2023-11-16 ENCOUNTER — Other Ambulatory Visit: Payer: Self-pay

## 2023-11-16 MED ORDER — TIRZEPATIDE-WEIGHT MANAGEMENT 5 MG/0.5ML ~~LOC~~ SOLN: 5.0000 mg | SUBCUTANEOUS | 2 refills | Status: DC

## 2023-11-20 NOTE — Telephone Encounter (Signed)
 Please review patient message.   JM

## 2024-01-08 ENCOUNTER — Encounter: Payer: Self-pay | Admitting: Family Medicine

## 2024-01-08 NOTE — Telephone Encounter (Signed)
 Please review.  KP

## 2024-01-10 ENCOUNTER — Other Ambulatory Visit: Payer: Self-pay

## 2024-01-10 MED ORDER — ZEPBOUND 12.5 MG/0.5ML ~~LOC~~ SOAJ: 12.5000 mg | SUBCUTANEOUS | 0 refills | Status: DC

## 2024-01-10 NOTE — Telephone Encounter (Signed)
 Please review.  KP

## 2024-01-11 ENCOUNTER — Other Ambulatory Visit: Payer: Self-pay

## 2024-01-11 MED ORDER — ZEPBOUND 12.5 MG/0.5ML ~~LOC~~ SOAJ: 12.5000 mg | SUBCUTANEOUS | 0 refills | Status: DC

## 2024-01-12 ENCOUNTER — Other Ambulatory Visit: Payer: Self-pay

## 2024-01-12 MED ORDER — TIRZEPATIDE-WEIGHT MANAGEMENT 10 MG/0.5ML ~~LOC~~ SOLN: 10.0000 mg | SUBCUTANEOUS | 2 refills | Status: DC

## 2024-01-17 NOTE — Telephone Encounter (Signed)
 Please review and advise.

## 2024-01-18 ENCOUNTER — Other Ambulatory Visit: Payer: Self-pay

## 2024-01-18 MED ORDER — ZEPBOUND 12.5 MG/0.5ML ~~LOC~~ SOAJ: 12.5000 mg | SUBCUTANEOUS | 0 refills | Status: DC

## 2024-01-25 ENCOUNTER — Telehealth: Payer: Self-pay | Admitting: Pharmacy Technician

## 2024-01-25 ENCOUNTER — Other Ambulatory Visit (HOSPITAL_COMMUNITY): Payer: Self-pay

## 2024-01-25 NOTE — Telephone Encounter (Signed)
 Pharmacy Patient Advocate Encounter   Received notification from Onbase that prior authorization for Zepbound  12.5MG /0.5ML pen-injectors is required/requested.   Insurance verification completed.   The patient is insured through [employer] .   Per test claim: PA required; PA submitted to above mentioned insurance via CoverMyMeds Key/confirmation #/EOC [id number] Status is pending

## 2024-01-30 ENCOUNTER — Other Ambulatory Visit (HOSPITAL_COMMUNITY): Payer: Self-pay

## 2024-01-30 NOTE — Telephone Encounter (Signed)
 Good morning, I called [facility] this morning since I hadn't gotten a response yet on Louis Marquez's Zepbound  and spoke to Louis W. who informed me that Zepbound  is Plan/Benefit Exclusion and not covered by his plan at all

## 2024-02-04 ENCOUNTER — Other Ambulatory Visit: Payer: Self-pay | Admitting: Family Medicine

## 2024-02-04 DIAGNOSIS — E782 Mixed hyperlipidemia: Secondary | ICD-10-CM

## 2024-02-06 NOTE — Telephone Encounter (Signed)
 Requested Prescriptions  Pending Prescriptions Disp Refills   fenofibrate  micronized (ANTARA ) 43 MG capsule [Pharmacy Med Name: FENOFIBRATE  43MG  CAPSULES] 90 capsule 0    Sig: TAKE 1 CAPSULE(43 MG) BY MOUTH DAILY BEFORE BREAKFAST     Cardiovascular:  Antilipid - Fibric Acid Derivatives Failed - 02/06/2024 11:28 AM      Failed - Lipid Panel in normal range within the last 12 months    Cholesterol, Total  Date Value Ref Range Status  09/12/2023 136 100 - 199 mg/dL Final   LDL Chol Calc (NIH)  Date Value Ref Range Status  09/12/2023 42 0 - 99 mg/dL Final   Direct LDL  Date Value Ref Range Status  04/03/2017 89.0 mg/dL Final    Comment:    Optimal:  <100 mg/dLNear or Above Optimal:  100-129 mg/dLBorderline High:  130-159 mg/dLHigh:  160-189 mg/dLVery High:  >190 mg/dL   HDL  Date Value Ref Range Status  09/12/2023 44 >39 mg/dL Final   Triglycerides  Date Value Ref Range Status  09/12/2023 332 (H) 0 - 149 mg/dL Final         Passed - ALT in normal range and within 360 days    ALT  Date Value Ref Range Status  09/12/2023 23 0 - 44 IU/L Final         Passed - AST in normal range and within 360 days    AST  Date Value Ref Range Status  09/12/2023 24 0 - 40 IU/L Final         Passed - Cr in normal range and within 360 days    Creatinine, Ser  Date Value Ref Range Status  09/12/2023 1.02 0.76 - 1.27 mg/dL Final         Passed - HGB in normal range and within 360 days    Hemoglobin  Date Value Ref Range Status  09/12/2023 15.1 13.0 - 17.7 g/dL Final         Passed - HCT in normal range and within 360 days    Hematocrit  Date Value Ref Range Status  09/12/2023 45.6 37.5 - 51.0 % Final         Passed - PLT in normal range and within 360 days    Platelets  Date Value Ref Range Status  09/12/2023 259 150 - 450 x10E3/uL Final         Passed - WBC in normal range and within 360 days    WBC  Date Value Ref Range Status  09/12/2023 7.1 3.4 - 10.8 x10E3/uL Final   04/03/2017 8.4 4.0 - 10.5 K/uL Final         Passed - eGFR is 30 or above and within 360 days    GFR  Date Value Ref Range Status  04/03/2017 82.53 >60.00 mL/min Final   eGFR  Date Value Ref Range Status  09/12/2023 90 >59 mL/min/1.73 Final         Passed - Valid encounter within last 12 months    Recent Outpatient Visits           2 months ago Encounter for weight management    Primary Care & Sports Medicine at [facility], Dessie Flow, MD       Future Appointments             In 2 weeks Augustus Ledger, Dessie Flow, MD [facility] Primary Care & Sports Medicine at [facility], [facility]   In 1 month Ma Saupe, MD [facility] Primary Care & Sports Medicine at [facility], [facility]

## 2024-02-07 ENCOUNTER — Telehealth: Payer: Self-pay

## 2024-02-07 ENCOUNTER — Other Ambulatory Visit: Payer: Self-pay

## 2024-02-07 DIAGNOSIS — Z0000 Encounter for general adult medical examination without abnormal findings: Secondary | ICD-10-CM

## 2024-02-07 NOTE — Telephone Encounter (Signed)
 LMOM letting patient know about lab orders.

## 2024-02-07 NOTE — Telephone Encounter (Signed)
 Copied from CRM [id number]. Topic: Clinical - Request for Lab/Test Order >> Feb 07, 2024  9:13 AM Essie A wrote: Reason for CRM: Patient called looking for lab orders that his doctor, Dr. Augustus Ledger, was to put in place.  He will need the order sent to [facility].  Please let him know when this has been done. His phone number is [phone].

## 2024-02-10 LAB — COMPREHENSIVE METABOLIC PANEL WITH GFR
ALT: 25 IU/L (ref 0–44)
AST: 26 IU/L (ref 0–40)
Albumin: 4.4 g/dL (ref 4.1–5.1)
Alkaline Phosphatase: 68 IU/L (ref 44–121)
BUN/Creatinine Ratio: 15 (ref 9–20)
BUN: 16 mg/dL (ref 6–24)
Bilirubin Total: 0.5 mg/dL (ref 0.0–1.2)
CO2: 22 mmol/L (ref 20–29)
Calcium: 9.2 mg/dL (ref 8.7–10.2)
Chloride: 105 mmol/L (ref 96–106)
Creatinine, Ser: 1.06 mg/dL (ref 0.76–1.27)
Globulin, Total: 2.3 g/dL (ref 1.5–4.5)
Glucose: 94 mg/dL (ref 70–99)
Potassium: 4.4 mmol/L (ref 3.5–5.2)
Sodium: 142 mmol/L (ref 134–144)
Total Protein: 6.7 g/dL (ref 6.0–8.5)
eGFR: 85 mL/min/{1.73_m2} (ref >59)

## 2024-02-10 LAB — VITAMIN D 25 HYDROXY (VIT D DEFICIENCY, FRACTURES): Vit D, 25-Hydroxy: 53.3 ng/mL (ref 30.0–100.0)

## 2024-02-10 LAB — LIPID PANEL
Chol/HDL Ratio: 2.6 ratio (ref 0.0–5.0)
Cholesterol, Total: 122 mg/dL (ref 100–199)
HDL: 47 mg/dL (ref >39)
LDL Chol Calc (NIH): 56 mg/dL (ref 0–99)
Triglycerides: 105 mg/dL (ref 0–149)
VLDL Cholesterol Cal: 19 mg/dL (ref 5–40)

## 2024-02-13 ENCOUNTER — Ambulatory Visit: Payer: BC Managed Care – PPO | Admitting: Family Medicine

## 2024-02-15 ENCOUNTER — Encounter: Payer: Self-pay | Admitting: Family Medicine

## 2024-02-15 NOTE — Telephone Encounter (Signed)
 Please review and advise.   JM

## 2024-02-16 LAB — APO A1 + B + RATIO
Apolipo. B/A-1 Ratio: 0.4 ratio (ref 0.0–0.7)
Apolipoprotein A-1: 151 mg/dL (ref 101–178)
Apolipoprotein B: 57 mg/dL (ref <90)

## 2024-02-16 LAB — TSH RFX ON ABNORMAL TO FREE T4: TSH: 3.84 u[IU]/mL (ref 0.450–4.500)

## 2024-02-16 LAB — SPECIMEN STATUS REPORT

## 2024-02-16 NOTE — Telephone Encounter (Signed)
 Please review and advise patient.   JM

## 2024-02-20 ENCOUNTER — Ambulatory Visit: Admitting: Family Medicine

## 2024-02-26 ENCOUNTER — Encounter: Payer: Self-pay | Admitting: Family Medicine

## 2024-02-26 ENCOUNTER — Ambulatory Visit: Admitting: Family Medicine

## 2024-02-26 VITALS — BP 112/70 | HR 101 | Ht 73.0 in | Wt 254.0 lb

## 2024-02-26 DIAGNOSIS — E039 Hypothyroidism, unspecified: Secondary | ICD-10-CM

## 2024-02-26 DIAGNOSIS — K219 Gastro-esophageal reflux disease without esophagitis: Secondary | ICD-10-CM

## 2024-02-26 DIAGNOSIS — E782 Mixed hyperlipidemia: Secondary | ICD-10-CM | POA: Diagnosis not present

## 2024-02-26 DIAGNOSIS — Z6833 Body mass index (BMI) 33.0-33.9, adult: Secondary | ICD-10-CM

## 2024-02-26 DIAGNOSIS — M7541 Impingement syndrome of right shoulder: Secondary | ICD-10-CM | POA: Diagnosis not present

## 2024-02-26 DIAGNOSIS — R7303 Prediabetes: Secondary | ICD-10-CM

## 2024-02-26 MED ORDER — DICLOFENAC SODIUM 75 MG PO TBEC: 75.0000 mg | DELAYED_RELEASE_TABLET | Freq: Two times a day (BID) | ORAL | 0 refills | Status: DC | PRN

## 2024-02-26 NOTE — Assessment & Plan Note (Signed)
 He has been actively managing his weight, with a reduction from 273 pounds to 254 pounds since December 2024, attributed to starting Zepbound  in January 2025. He notes a plateau in weight loss and is currently taking 12.5 mg doses. He is exploring options for insurance coverage as his current plan changes in July.  Obesity Obesity management with significant weight loss since starting Zepbound . Current weight plateau at 254 lbs from 273 lbs in December. Insurance issues with Zepbound  coverage, currently self-paying and using [employer]. Discussed potential for insurance coverage through obstructive sleep apnea indication. - Complete paperwork for Zepbound . - Continue current dose of Zepbound  and consider increasing dose if insurance allows. - Encourage continued healthy lifestyle and dietary habits. - Explore alternative injection sites to reduce irritation.

## 2024-02-26 NOTE — Assessment & Plan Note (Signed)
 He reports a recent onset of right shoulder pain, which he attributes to possibly sleeping on it wrong. The pain is localized to the front of the shoulder and is aggravated by lifting. He has taken ibuprofen for the pain with some relief.  Physical Exam MEASUREMENTS: Weight- 254. PALPATION: Tenderness in the right bicipital groove. Nontender subacromial area, trapezius, and venous capillary border on the right. STRENGTH: 5/5 strength at the rotator cuff. Internal and external rotation 5/5 strength, painless. SPECIAL TESTS: Positive Hawkins and empty can tests on the right. Negative Speed's and Yergason's tests.  Right shoulder rotator cuff impingement Right shoulder pain with tenderness in the bicipital groove and positive Hawkins and empty can tests, indicating possible rotator cuff involvement, likely due to overuse or strain. - Prescribed diclofenac for inflammation and pain management.  Take this twice daily x 1-2 weeks until symptoms improved. - Initiate home-based rehabilitation at the 1-2-week mark if symptoms allow. - Advised to avoid overhead activities that exacerbate pain. - Reassess if symptoms persist after two weeks of treatment.  Can escalate to injection.

## 2024-02-26 NOTE — Progress Notes (Signed)
 Primary Care / Sports Medicine Office Visit  Patient Information:  Patient ID: Louis Marquez, male DOB: [birth year] Age: 53 y.o. MRN: [id number]   Louis Marquez is a pleasant 53 y.o. male presenting with the following:  Chief Complaint  Patient presents with   Weight Check    Patient present today for a follow up on his weight. He has been taking Zepbound  12.5 mg and doing well. He would like to discuss going up in dose.    Shoulder Pain    Right shoulder pain x 1 week. NKI. Painful when lifting and ROM is decreased.     Vitals:   02/26/24 1049  BP: 112/70  Pulse: (!) 101  SpO2: 96%   Vitals:   02/26/24 1049  Weight: 254 lb (115.2 kg)  Height: 6\' 1"  (1.854 m)   Body mass index is 33.51 kg/m.  No results found.   Independent interpretation of notes and tests performed by another provider:   None  Procedures performed:   None  Pertinent History, Exam, Impression, and Recommendations:   Problem List Items Addressed This Visit     BMI 33.0-33.9,adult   He has been actively managing his weight, with a reduction from 273 pounds to 254 pounds since December 2024, attributed to starting Zepbound  in January 2025. He notes a plateau in weight loss and is currently taking 12.5 mg doses. He is exploring options for insurance coverage as his current plan changes in July.  Obesity Obesity management with significant weight loss since starting Zepbound . Current weight plateau at 254 lbs from 273 lbs in December. Insurance issues with Zepbound  coverage, currently self-paying and using [employer]. Discussed potential for insurance coverage through obstructive sleep apnea indication. - Complete paperwork for Zepbound . - Continue current dose of Zepbound  and consider increasing dose if insurance allows. - Encourage continued healthy lifestyle and dietary habits. - Explore alternative injection sites to reduce irritation.      GASTROESOPHAGEAL REFLUX DISEASE   No  significant gastrointestinal issues are reported, although he continues to take omeprazole as needed for reflux, particularly when consuming foods that typically exacerbate his symptoms. His bowel movements have decreased in frequency since starting Zepbound , but they remain smooth.  Gastroesophageal reflux disease (GERD) GERD managed with omeprazole. No significant reflux symptoms reported, but potential for omeprazole medication reduction discussed. - Consider reducing omeprazole to as-needed basis, especially with known triggers like spicy or greasy foods. - Monitor symptoms and adjust as necessary.      Hyperlipidemia   He has a history of elevated cholesterol and triglycerides, which have improved significantly over the past year due to a combination of weight loss and medication. He is currently taking rosuvastatin  and fenofibrate .  Hyperlipidemia Hyperlipidemia well-controlled with current medication regimen. Significant improvement in cholesterol levels, likely due to combination of weight loss and medication. Current cholesterol ratio is 2.6, indicating less than half of the average risk. - Continue rosuvastatin  and icosapent  Ethyl (Vascepa ). - Consider discontinuing fenofibrate  after current supply is finished, reassess in December. - Monitor cholesterol levels at next physical in December.      Hypothyroidism   Hypothyroidism Hypothyroidism well-managed with levothyroxine. Thyroid  levels are within normal range, indicating appropriate dosing. - Continue current dose of levothyroxine. - Follow up with endocrinologist as scheduled.      Prediabetes   Prediabetes Previous A1c in prediabetes range but not currently checked. Other markers are trending positively, suggesting improved metabolic control. He is comfortable waiting until next physical  for re-evaluation unless symptoms arise. - Recheck A1c at next physical in December unless symptoms or concerns arise.      Rotator  cuff impingement syndrome of right shoulder - Primary   He reports a recent onset of right shoulder pain, which he attributes to possibly sleeping on it wrong. The pain is localized to the front of the shoulder and is aggravated by lifting. He has taken ibuprofen for the pain with some relief.  Physical Exam MEASUREMENTS: Weight- 254. PALPATION: Tenderness in the right bicipital groove. Nontender subacromial area, trapezius, and venous capillary border on the right. STRENGTH: 5/5 strength at the rotator cuff. Internal and external rotation 5/5 strength, painless. SPECIAL TESTS: Positive Hawkins and empty can tests on the right. Negative Speed's and Yergason's tests.  Right shoulder rotator cuff impingement Right shoulder pain with tenderness in the bicipital groove and positive Hawkins and empty can tests, indicating possible rotator cuff involvement, likely due to overuse or strain. - Prescribed diclofenac for inflammation and pain management.  Take this twice daily x 1-2 weeks until symptoms improved. - Initiate home-based rehabilitation at the 1-2-week mark if symptoms allow. - Advised to avoid overhead activities that exacerbate pain. - Reassess if symptoms persist after two weeks of treatment.  Can escalate to injection.      Relevant Medications   diclofenac (VOLTAREN) 75 MG EC tablet   I provided a total time of 42 minutes including both face-to-face and non-face-to-face time on 02/26/2024 inclusive of time utilized for medical chart review, information gathering, care coordination with staff, and documentation completion.   Orders & Medications Medications:  Meds ordered this encounter  Medications   diclofenac (VOLTAREN) 75 MG EC tablet    Sig: Take 1 tablet (75 mg total) by mouth 2 (two) times daily as needed.    Dispense:  60 tablet    Refill:  0   No orders of the defined types were placed in this encounter.    Return in about 26 weeks (around 08/26/2024) for CPE.      Ma Saupe, MD, [facility]   Primary Care Sports Medicine Primary Care and Sports Medicine at [facility]

## 2024-02-26 NOTE — Assessment & Plan Note (Signed)
 Prediabetes Previous A1c in prediabetes range but not currently checked. Other markers are trending positively, suggesting improved metabolic control. He is comfortable waiting until next physical for re-evaluation unless symptoms arise. - Recheck A1c at next physical in December unless symptoms or concerns arise.

## 2024-02-26 NOTE — Assessment & Plan Note (Signed)
 He has a history of elevated cholesterol and triglycerides, which have improved significantly over the past year due to a combination of weight loss and medication. He is currently taking rosuvastatin  and fenofibrate .  Hyperlipidemia Hyperlipidemia well-controlled with current medication regimen. Significant improvement in cholesterol levels, likely due to combination of weight loss and medication. Current cholesterol ratio is 2.6, indicating less than half of the average risk. - Continue rosuvastatin  and icosapent  Ethyl (Vascepa ). - Consider discontinuing fenofibrate  after current supply is finished, reassess in December. - Monitor cholesterol levels at next physical in December.

## 2024-02-26 NOTE — Assessment & Plan Note (Signed)
 No significant gastrointestinal issues are reported, although he continues to take omeprazole as needed for reflux, particularly when consuming foods that typically exacerbate his symptoms. His bowel movements have decreased in frequency since starting Zepbound , but they remain smooth.  Gastroesophageal reflux disease (GERD) GERD managed with omeprazole. No significant reflux symptoms reported, but potential for omeprazole medication reduction discussed. - Consider reducing omeprazole to as-needed basis, especially with known triggers like spicy or greasy foods. - Monitor symptoms and adjust as necessary.

## 2024-02-26 NOTE — Patient Instructions (Signed)
 Patient Action Plan  1. Weight Management:    - Complete paperwork for Zepbound  insurance coverage.    - Continue taking the current dose of Zepbound , and consider increasing the dose if covered by insurance.    - Maintain healthy lifestyle and dietary habits.    - Explore alternative injection sites to reduce irritation.  2. GERD Management:    - Consider reducing omeprazole to an as-needed basis for known triggers like spicy or greasy foods.    - Monitor symptoms and adjust medication as necessary.  3. Hyperlipidemia Management:    - Continue taking rosuvastatin  and icosapent  ethyl (Vascepa ).    - Finish current supply of fenofibrate  and consider discontinuing it. Reassess in December.    - Monitor cholesterol levels at your next physical in December.  4. Hypothyroidism Management:    - Continue current dose of levothyroxine.    - Follow up with your endocrinologist as scheduled.  5. Prediabetes Monitoring:    - Plan to recheck A1c at your next physical in December unless symptoms or concerns arise.  6. Right Shoulder Pain Management:    - Take diclofenac twice daily for 1-2 weeks to manage inflammation and pain.    - Start home-based rehabilitation exercises after 1-2 weeks if symptoms improve.    - Avoid overhead activities that cause pain.    - Reassess if symptoms persist after two weeks; consider escalation to injection if needed.  Red Flags: Contact your healthcare provider if you experience severe or worsening symptoms in any of these areas, such as increased shoulder pain, significant changes in weight management response, or new gastrointestinal issues.

## 2024-02-26 NOTE — Assessment & Plan Note (Signed)
 Hypothyroidism Hypothyroidism well-managed with levothyroxine. Thyroid  levels are within normal range, indicating appropriate dosing. - Continue current dose of levothyroxine. - Follow up with endocrinologist as scheduled.

## 2024-03-21 ENCOUNTER — Telehealth: Payer: Self-pay | Admitting: Family Medicine

## 2024-03-24 ENCOUNTER — Other Ambulatory Visit: Payer: Self-pay | Admitting: Family Medicine

## 2024-03-24 DIAGNOSIS — M7541 Impingement syndrome of right shoulder: Secondary | ICD-10-CM

## 2024-03-25 ENCOUNTER — Telehealth: Admitting: Family Medicine

## 2024-03-26 NOTE — Telephone Encounter (Signed)
 Requested Prescriptions  Pending Prescriptions Disp Refills   diclofenac  (VOLTAREN ) 75 MG EC tablet [Pharmacy Med Name: DICLOFENAC  SODIUM 75MG  DR TABLETS] 180 tablet 1    Sig: TAKE 1 TABLET(75 MG) BY MOUTH TWICE DAILY AS NEEDED     Analgesics:  NSAIDS Failed - 03/26/2024  9:48 AM      Failed - Manual Review: Labs are only required if the patient has taken medication for more than 8 weeks.      Passed - Cr in normal range and within 360 days    Creatinine, Ser  Date Value Ref Range Status  02/09/2024 1.06 0.76 - 1.27 mg/dL Final         Passed - HGB in normal range and within 360 days    Hemoglobin  Date Value Ref Range Status  09/12/2023 15.1 13.0 - 17.7 g/dL Final         Passed - PLT in normal range and within 360 days    Platelets  Date Value Ref Range Status  09/12/2023 259 150 - 450 x10E3/uL Final         Passed - HCT in normal range and within 360 days    Hematocrit  Date Value Ref Range Status  09/12/2023 45.6 37.5 - 51.0 % Final         Passed - eGFR is 30 or above and within 360 days    GFR  Date Value Ref Range Status  04/03/2017 82.53 >60.00 mL/min Final   eGFR  Date Value Ref Range Status  02/09/2024 85 >59 mL/min/1.73 Final         Passed - Patient is not pregnant      Passed - Valid encounter within last 12 months    Recent Outpatient Visits           4 weeks ago Rotator cuff impingement syndrome of right shoulder   [place] [facility] at [facility], Selinda PARAS, MD   4 months ago Encounter for weight management   [place] [facility] Medicine at [facility], Selinda PARAS, MD       Future Appointments             In 5 months Ku, Selinda PARAS, MD [facility] Primary Care & Sports Medicine at [facility]

## 2024-04-09 ENCOUNTER — Encounter: Payer: Self-pay | Admitting: Family Medicine

## 2024-04-09 ENCOUNTER — Other Ambulatory Visit: Payer: Self-pay

## 2024-04-09 DIAGNOSIS — R7303 Prediabetes: Secondary | ICD-10-CM

## 2024-04-09 DIAGNOSIS — Z6833 Body mass index (BMI) 33.0-33.9, adult: Secondary | ICD-10-CM

## 2024-04-09 MED ORDER — TIRZEPATIDE-WEIGHT MANAGEMENT 10 MG/0.5ML ~~LOC~~ SOLN: 10.0000 mg | SUBCUTANEOUS | 1 refills | Status: DC

## 2024-04-11 ENCOUNTER — Other Ambulatory Visit: Payer: Self-pay

## 2024-04-11 ENCOUNTER — Other Ambulatory Visit: Payer: Self-pay | Admitting: Family Medicine

## 2024-04-11 MED ORDER — TIRZEPATIDE-WEIGHT MANAGEMENT 15 MG/0.5ML ~~LOC~~ SOAJ: 15.0000 mg | SUBCUTANEOUS | 0 refills | Status: DC

## 2024-04-11 NOTE — Telephone Encounter (Signed)
 Please review attachment and advise patient. I have already informed him the 15 mg vial is not in our system. I looked again just to make sure and we only have the 15 mg injection not vial. Pleaser advise patient. Thank you.  JM

## 2024-04-11 NOTE — Progress Notes (Signed)
 Disregard.  JM

## 2024-04-22 ENCOUNTER — Other Ambulatory Visit: Payer: Self-pay

## 2024-04-22 DIAGNOSIS — Z6833 Body mass index (BMI) 33.0-33.9, adult: Secondary | ICD-10-CM

## 2024-04-22 DIAGNOSIS — G4733 Obstructive sleep apnea (adult) (pediatric): Secondary | ICD-10-CM

## 2024-04-22 DIAGNOSIS — R7303 Prediabetes: Secondary | ICD-10-CM

## 2024-04-22 MED ORDER — TIRZEPATIDE-WEIGHT MANAGEMENT 15 MG/0.5ML ~~LOC~~ SOAJ: 15.0000 mg | SUBCUTANEOUS | 0 refills | Status: DC

## 2024-04-22 MED ORDER — ZEPBOUND 12.5 MG/0.5ML ~~LOC~~ SOAJ
12.5000 mg | SUBCUTANEOUS | 0 refills | Status: DC
Start: 2024-04-22 — End: 2024-05-07

## 2024-04-23 ENCOUNTER — Other Ambulatory Visit (HOSPITAL_COMMUNITY): Payer: Self-pay

## 2024-04-23 NOTE — Telephone Encounter (Signed)
 Please submit Prior auth for sleep apnea to website [email or web address] . Thank you.  JM

## 2024-04-23 NOTE — Telephone Encounter (Signed)
 Good morning I contacted [employer] [phone]) this morning and had to leave a message. I'm waiting on a return call and will update as soon as I get more information

## 2024-04-30 NOTE — Telephone Encounter (Signed)
 Please review and advise patient.   JM

## 2024-04-30 NOTE — Telephone Encounter (Signed)
 Please review patients message above about the [email or web address] to get his zepbound  covered through his insurance.  JM

## 2024-04-30 NOTE — Telephone Encounter (Signed)
 Discussed with patient that if he is wanting to pursue Zepbound  for OSA, given the criteria outlined by his insurance is prior authorization form, we would need to have a sleep medicine group following him so that they can assess objective data (numbers from machine, etc.) to fulfill the prior authorization requirements.  If he is wanting to pursue this, we can coordinate a referral to [facility] medicine in [place] or any formal sleep medicine group of his choosing.  Thank you, JJM

## 2024-05-01 NOTE — Telephone Encounter (Signed)
 Please call patient and inform him about getting Prior auth approval using OSA as reasoning for taking Zepbound .  JM

## 2024-05-02 NOTE — Telephone Encounter (Signed)
 Please review.  KP

## 2024-05-02 NOTE — Telephone Encounter (Signed)
 Please review and advise.   JM

## 2024-05-03 ENCOUNTER — Telehealth: Payer: Self-pay | Admitting: Family Medicine

## 2024-05-03 ENCOUNTER — Other Ambulatory Visit (HOSPITAL_COMMUNITY): Payer: Self-pay

## 2024-05-03 ENCOUNTER — Telehealth: Payer: Self-pay | Admitting: Pharmacy Technician

## 2024-05-03 ENCOUNTER — Telehealth: Payer: Self-pay

## 2024-05-03 ENCOUNTER — Encounter: Payer: Self-pay | Admitting: Family Medicine

## 2024-05-03 NOTE — Telephone Encounter (Signed)
 Spoke with with Lynwood the pharmacist at [facility] he will need this approval from you as he doesn't think it is safe for patient to jump from the 12.5  mg dose to the 15 mg dose because patient never picked up the 12.5 mg dose of zepbound  last month. So he has been off Zepbound  medication for a whole month. Pharmacist Lynwood would hate for the patient to be hospitalized do to this high dose. So he would prefer to speak with you directly and not take verbal order from me.   JM

## 2024-05-03 NOTE — Telephone Encounter (Signed)
 Thanks Dr. Alvia, I submitted the PA this morning with sleep studies attached for OSA, they are now asking for a current weight and BMI within 24 hours of submitting the PA. I made myself very clear this was only for OSA and only gave them 1 diagnosis code of G47.33 but they still needed his current weight and BMI  His case number is [id number] and the fax number is [phone] or you can send me documentation and I can send it in

## 2024-05-03 NOTE — Telephone Encounter (Signed)
 Pharmacy Patient Advocate Encounter   Received notification from Physician's Office that prior authorization for ZEPBOUND  15MG  is required/requested.   Insurance verification completed.   The patient is insured through [employer] ([employer]).   Per test claim: PA required; PA submitted to above mentioned insurance via Phone Key/confirmation #/EOC [id number] Status is pending  PA was completed over the phone and documentation was faxed over and expedited.

## 2024-05-03 NOTE — Telephone Encounter (Signed)
 This is what I sent them this morning the date is too old it has to be within the last 45 days

## 2024-05-03 NOTE — Telephone Encounter (Signed)
 Spoke with Olam Scientist, research (physical sciences)) at [facility]. Verbal orders for Zepbound  15 mg vial with 2 refills.   JM

## 2024-05-03 NOTE — Telephone Encounter (Signed)
 Copied from CRM [id number]. Topic: Clinical - Medication Prior Auth >> May 02, 2024  5:29 PM Sophia H wrote: Reason for CRM: Patient is calling in very upset, using profanity and wants to speak with management about prior auth that he states was never submitted when he was told it was back in July. States CMA he has been speaking with is not providing accurate information and he wants to speak directly with Dr. Alvia. Please reach out and advise, this is regarding the auth for zepbound . # [id number].  **Patient states if he does not hear back in the morning by phone he will be driving to the office to probably make an a** of myself heads up. Ty

## 2024-05-03 NOTE — Telephone Encounter (Signed)
 Addendum, one 15 mg vial with 2 refills was sent in (3 months total)

## 2024-05-03 NOTE — Telephone Encounter (Signed)
 Good afternoon, Louis Marquez insurance is requesting additional info.  They need a current weight and BMI within the last 30 days and they are asking for this information within 24 hours and it needs to be documented in his chart

## 2024-05-03 NOTE — Telephone Encounter (Signed)
 Had a lengthy conversation with patient, he is seeking the following:  - Interim refill of vials of Zepbound  15 mg from [employer] (cash pay)  - Sending in 1 vial with 3 refills (completed today)  - Seeking authorization for Zepbound  using dx of obstructive sleep apnea (insurance to pay)  - Kiana, please disregard authorization for obesity diagnosis after discussion with patient, instead please seek auth for the OSA diagnosis. He has a sleep study in the system showing severe sleep apnea with AHI of 35.8 from 02/06/2022

## 2024-05-03 NOTE — Telephone Encounter (Signed)
 Not sure if you need this. Please review  JM

## 2024-05-03 NOTE — Telephone Encounter (Signed)
 Copied from CRM #[id number]. Topic: Clinical - Prescription Issue >> May 03, 2024  3:49 PM Charlet HERO wrote: Reason for CRM:  [facility] pharmacy calling about zepbound  15mg  in vials she is seeing the 2.5 mg 7mg  and10mg   they cannot be filled without the lower dosages being canceled. Pharmacy is requesting for the lower dosages to be taken out of the system.

## 2024-05-03 NOTE — Telephone Encounter (Signed)
 Thanks Jessenia, I just faxed this updated sleep study over to his plan.

## 2024-05-07 ENCOUNTER — Other Ambulatory Visit (HOSPITAL_COMMUNITY): Payer: Self-pay

## 2024-05-07 ENCOUNTER — Other Ambulatory Visit: Payer: Self-pay | Admitting: Family Medicine

## 2024-05-07 ENCOUNTER — Other Ambulatory Visit: Payer: Self-pay

## 2024-05-07 DIAGNOSIS — E782 Mixed hyperlipidemia: Secondary | ICD-10-CM

## 2024-05-07 DIAGNOSIS — Z6833 Body mass index (BMI) 33.0-33.9, adult: Secondary | ICD-10-CM

## 2024-05-07 MED ORDER — TIRZEPATIDE-WEIGHT MANAGEMENT 15 MG/0.5ML ~~LOC~~ SOAJ: 15.0000 mg | SUBCUTANEOUS | 0 refills | Status: DC

## 2024-05-09 NOTE — Telephone Encounter (Signed)
 Requested Prescriptions  Pending Prescriptions Disp Refills   fenofibrate  micronized (ANTARA ) 43 MG capsule [Pharmacy Med Name: FENOFIBRATE  43MG  CAPSULES] 90 capsule 1    Sig: TAKE 1 CAPSULE(43 MG) BY MOUTH DAILY BEFORE BREAKFAST     Cardiovascular:  Antilipid - Fibric Acid Derivatives Failed - 05/09/2024  3:23 PM      Failed - Lipid Panel in normal range within the last 12 months    Cholesterol, Total  Date Value Ref Range Status  02/09/2024 122 100 - 199 mg/dL Final   LDL Chol Calc (NIH)  Date Value Ref Range Status  02/09/2024 56 0 - 99 mg/dL Final   Direct LDL  Date Value Ref Range Status  04/03/2017 89.0 mg/dL Final    Comment:    Optimal:  <100 mg/dLNear or Above Optimal:  100-129 mg/dLBorderline High:  130-159 mg/dLHigh:  160-189 mg/dLVery High:  >190 mg/dL   HDL  Date Value Ref Range Status  02/09/2024 47 >39 mg/dL Final   Triglycerides  Date Value Ref Range Status  02/09/2024 105 0 - 149 mg/dL Final         Passed - ALT in normal range and within 360 days    ALT  Date Value Ref Range Status  02/09/2024 25 0 - 44 IU/L Final         Passed - AST in normal range and within 360 days    AST  Date Value Ref Range Status  02/09/2024 26 0 - 40 IU/L Final         Passed - Cr in normal range and within 360 days    Creatinine, Ser  Date Value Ref Range Status  02/09/2024 1.06 0.76 - 1.27 mg/dL Final         Passed - HGB in normal range and within 360 days    Hemoglobin  Date Value Ref Range Status  09/12/2023 15.1 13.0 - 17.7 g/dL Final         Passed - HCT in normal range and within 360 days    Hematocrit  Date Value Ref Range Status  09/12/2023 45.6 37.5 - 51.0 % Final         Passed - PLT in normal range and within 360 days    Platelets  Date Value Ref Range Status  09/12/2023 259 150 - 450 x10E3/uL Final         Passed - WBC in normal range and within 360 days    WBC  Date Value Ref Range Status  09/12/2023 7.1 3.4 - 10.8 x10E3/uL Final   04/03/2017 8.4 4.0 - 10.5 K/uL Final         Passed - eGFR is 30 or above and within 360 days    GFR  Date Value Ref Range Status  04/03/2017 82.53 >60.00 mL/min Final   eGFR  Date Value Ref Range Status  02/09/2024 85 >59 mL/min/1.73 Final         Passed - Valid encounter within last 12 months    Recent Outpatient Visits           2 months ago Rotator cuff impingement syndrome of right shoulder   [place] [facility], Selinda PARAS, MD   5 months ago Encounter for weight management   [place] [facility] at [facility], Selinda PARAS, MD       Future Appointments  In 3 months Alvia, Selinda PARAS, MD [facility] Primary Care & Sports Medicine at [facility], [facility]

## 2024-07-16 ENCOUNTER — Encounter: Payer: Self-pay | Admitting: Family Medicine

## 2024-07-16 NOTE — Telephone Encounter (Signed)
 Please send patient RX if appropriate.   JM

## 2024-07-18 ENCOUNTER — Other Ambulatory Visit: Payer: Self-pay | Admitting: Family Medicine

## 2024-07-18 DIAGNOSIS — E782 Mixed hyperlipidemia: Secondary | ICD-10-CM

## 2024-07-18 MED ORDER — ICOSAPENT ETHYL 1 G PO CAPS: 2.0000 g | ORAL_CAPSULE | Freq: Two times a day (BID) | ORAL | 3 refills | Status: AC

## 2024-07-18 NOTE — Telephone Encounter (Signed)
 Please review and advise if Ethyl 1 g capsule prescription will be sent.  JM

## 2024-07-25 ENCOUNTER — Telehealth: Payer: Self-pay | Admitting: Pharmacy Technician

## 2024-07-25 ENCOUNTER — Other Ambulatory Visit (HOSPITAL_COMMUNITY): Payer: Self-pay

## 2024-07-25 NOTE — Telephone Encounter (Signed)
 Pharmacy Patient Advocate Encounter   Received notification from Patient Advice Request messages that prior authorization for Icosapent  Ethyl 1 g capsule is required/requested.   Insurance verification completed.   The patient is insured through [employer].   Per test claim: Per test claim, medication is not covered due to plan/benefit exclusion, PA not submitted at this time

## 2024-07-25 NOTE — Telephone Encounter (Signed)
 Please seek PA for icosapent  Ethyl 1 g capsule, thank you.  JM

## 2024-07-25 NOTE — Telephone Encounter (Signed)
 PA request has been Received. New Encounter has been or will be created for follow up. For additional info see Pharmacy Prior Auth telephone encounter from 07/25/24.

## 2024-07-29 NOTE — Telephone Encounter (Signed)
 See message.

## 2024-08-12 ENCOUNTER — Other Ambulatory Visit: Payer: Self-pay | Admitting: Family Medicine

## 2024-08-12 DIAGNOSIS — Z6833 Body mass index (BMI) 33.0-33.9, adult: Secondary | ICD-10-CM

## 2024-08-13 ENCOUNTER — Encounter: Payer: Self-pay | Admitting: Family Medicine

## 2024-08-13 ENCOUNTER — Other Ambulatory Visit: Payer: Self-pay

## 2024-08-13 DIAGNOSIS — Z6833 Body mass index (BMI) 33.0-33.9, adult: Secondary | ICD-10-CM

## 2024-08-13 MED ORDER — TIRZEPATIDE-WEIGHT MANAGEMENT 15 MG/0.5ML ~~LOC~~ SOAJ: 15.0000 mg | SUBCUTANEOUS | 0 refills | Status: DC

## 2024-08-15 NOTE — Telephone Encounter (Signed)
 Duplicate request, refilled 08/13/24.  Requested Prescriptions  Pending Prescriptions Disp Refills   ZEPBOUND  15 MG/0.5ML Pen [Pharmacy Med Name: ZEPBOUND  15MG /0.5ML INJ (4 PF PENS)] 6 mL 0    Sig: ADMINISTER 15 MG UNDER THE SKIN 1 TIME A WEEK     Off-Protocol Failed - 08/15/2024  9:27 AM      Failed - Medication not assigned to a protocol, review manually.      Passed - Valid encounter within last 12 months    Recent Outpatient Visits           5 months ago Rotator cuff impingement syndrome of right shoulder   [place] [facility] at [facility], Selinda PARAS, MD   9 months ago Encounter for weight management   [facility] at [facility], Selinda PARAS, MD       Future Appointments             In 3 weeks Ku, Selinda PARAS, MD [facility] Primary Care & Sports Medicine at [facility], [phone] [place]

## 2024-08-15 NOTE — Telephone Encounter (Signed)
 Please address. Thank you. JM

## 2024-08-29 ENCOUNTER — Encounter: Payer: Self-pay | Admitting: Family Medicine

## 2024-08-29 ENCOUNTER — Telehealth: Payer: Self-pay

## 2024-08-29 NOTE — Telephone Encounter (Signed)
 Copied from CRM #[id number]. Topic: Clinical - Lab/Test Results >> Aug 29, 2024  3:17 PM Geneva B wrote: Reason for CRM: pt is needing a lab order

## 2024-08-29 NOTE — Telephone Encounter (Signed)
 Please review.  KP

## 2024-09-02 NOTE — Telephone Encounter (Signed)
 Please review and advise patient.   JM

## 2024-09-03 ENCOUNTER — Other Ambulatory Visit: Payer: Self-pay | Admitting: Family Medicine

## 2024-09-03 DIAGNOSIS — R7303 Prediabetes: Secondary | ICD-10-CM

## 2024-09-03 DIAGNOSIS — Z0000 Encounter for general adult medical examination without abnormal findings: Secondary | ICD-10-CM

## 2024-09-03 DIAGNOSIS — E782 Mixed hyperlipidemia: Secondary | ICD-10-CM

## 2024-09-03 DIAGNOSIS — N400 Enlarged prostate without lower urinary tract symptoms: Secondary | ICD-10-CM

## 2024-09-03 DIAGNOSIS — E559 Vitamin D deficiency, unspecified: Secondary | ICD-10-CM

## 2024-09-03 NOTE — Telephone Encounter (Signed)
 Rx details:

## 2024-09-04 NOTE — Telephone Encounter (Signed)
 Please confirm what labs you have ordered for patient and inform him. Thank you.

## 2024-09-05 ENCOUNTER — Encounter: Admitting: Family Medicine

## 2024-09-11 ENCOUNTER — Other Ambulatory Visit: Payer: Self-pay | Admitting: Internal Medicine

## 2024-09-11 DIAGNOSIS — I2089 Other forms of angina pectoris: Secondary | ICD-10-CM

## 2024-09-13 ENCOUNTER — Other Ambulatory Visit: Payer: Self-pay | Admitting: Family Medicine

## 2024-09-13 DIAGNOSIS — Z6833 Body mass index (BMI) 33.0-33.9, adult: Secondary | ICD-10-CM

## 2024-09-17 NOTE — Telephone Encounter (Signed)
 Requested Prescriptions  Pending Prescriptions Disp Refills   ZEPBOUND  15 MG/0.5ML Pen [Pharmacy Med Name: ZEPBOUND  15MG /0.5ML INJ (4 PF PENS)] 6 mL 0    Sig: ADMINISTER 15 MG UNDER THE SKIN 1 TIME A WEEK     Off-Protocol Failed - 09/17/2024  1:43 PM      Failed - Medication not assigned to a protocol, review manually.      Passed - Valid encounter within last 12 months    Recent Outpatient Visits           6 months ago Rotator cuff impingement syndrome of right shoulder   [place] [facility] Medicine at [facility], Selinda PARAS, MD   10 months ago Encounter for weight management   [facility] Medicine at [facility], Selinda PARAS, MD

## 2024-09-18 LAB — COMPREHENSIVE METABOLIC PANEL WITH GFR
ALT: 18 IU/L (ref 0–44)
AST: 20 IU/L (ref 0–40)
Albumin: 4.1 g/dL (ref 3.8–4.9)
Alkaline Phosphatase: 65 IU/L (ref 47–123)
BUN/Creatinine Ratio: 12 (ref 9–20)
BUN: 12 mg/dL (ref 6–24)
Bilirubin Total: 0.4 mg/dL (ref 0.0–1.2)
CO2: 24 mmol/L (ref 20–29)
Calcium: 9.3 mg/dL (ref 8.7–10.2)
Chloride: 104 mmol/L (ref 96–106)
Creatinine, Ser: 0.97 mg/dL (ref 0.76–1.27)
Globulin, Total: 2.1 g/dL (ref 1.5–4.5)
Glucose: 95 mg/dL (ref 70–99)
Potassium: 4.8 mmol/L (ref 3.5–5.2)
Sodium: 140 mmol/L (ref 134–144)
Total Protein: 6.2 g/dL (ref 6.0–8.5)
eGFR: 95 mL/min/1.73

## 2024-09-18 LAB — CBC
Hematocrit: 46.3 % (ref 37.5–51.0)
Hemoglobin: 15.5 g/dL (ref 13.0–17.7)
MCH: 30.6 pg (ref 26.6–33.0)
MCHC: 33.5 g/dL (ref 31.5–35.7)
MCV: 91 fL (ref 79–97)
Platelets: 257 x10E3/uL (ref 150–450)
RBC: 5.07 x10E6/uL (ref 4.14–5.80)
RDW: 13.5 % (ref 11.6–15.4)
WBC: 8.8 x10E3/uL (ref 3.4–10.8)

## 2024-09-18 LAB — PSA TOTAL (REFLEX TO FREE): Prostate Specific Ag, Serum: 1 ng/mL (ref 0.0–4.0)

## 2024-09-18 LAB — LIPID PANEL
Chol/HDL Ratio: 2.3 ratio (ref 0.0–5.0)
Cholesterol, Total: 121 mg/dL (ref 100–199)
HDL: 52 mg/dL
LDL Chol Calc (NIH): 47 mg/dL (ref 0–99)
Triglycerides: 123 mg/dL (ref 0–149)
VLDL Cholesterol Cal: 22 mg/dL (ref 5–40)

## 2024-09-18 LAB — APO A1 + B + RATIO
Apolipo. B/A-1 Ratio: 0.3 ratio (ref 0.0–0.7)
Apolipoprotein A-1: 149 mg/dL (ref 101–178)
Apolipoprotein B: 51 mg/dL

## 2024-09-18 LAB — HEMOGLOBIN A1C
Est. average glucose Bld gHb Est-mCnc: 105 mg/dL
Hgb A1c MFr Bld: 5.3 % (ref 4.8–5.6)

## 2024-09-18 LAB — TSH+FREE T4
Free T4: 1.27 ng/dL (ref 0.82–1.77)
TSH: 4.57 u[IU]/mL — ABNORMAL HIGH (ref 0.450–4.500)

## 2024-09-18 LAB — VITAMIN D 25 HYDROXY (VIT D DEFICIENCY, FRACTURES): Vit D, 25-Hydroxy: 48.2 ng/mL (ref 30.0–100.0)

## 2024-09-24 ENCOUNTER — Ambulatory Visit: Payer: Self-pay | Admitting: Family Medicine

## 2024-10-03 ENCOUNTER — Ambulatory Visit: Admission: RE | Admit: 2024-10-03 | Source: Ambulatory Visit

## 2024-10-04 ENCOUNTER — Ambulatory Visit
Admission: RE | Admit: 2024-10-04 | Discharge: 2024-10-04 | Disposition: A | Payer: Self-pay | Source: Ambulatory Visit | Attending: Internal Medicine | Admitting: Internal Medicine

## 2024-10-04 DIAGNOSIS — I2089 Other forms of angina pectoris: Secondary | ICD-10-CM | POA: Insufficient documentation

## 2024-10-11 ENCOUNTER — Encounter: Admitting: Family Medicine
